# Patient Record
Sex: Female | Born: 1950 | Race: White | Hispanic: No | Marital: Married | Smoking: Former smoker
Health system: Southern US, Community
[De-identification: ages and names within clinical notes are randomized; demographics above are authoritative.]

## PROBLEM LIST (undated history)

## (undated) HISTORY — PX: TONSILLECTOMY: SUR1361

## (undated) HISTORY — PX: TUBAL LIGATION: SHX77

---

## 1977-03-05 HISTORY — PX: AUGMENTATION MAMMAPLASTY: SUR837

## 2004-03-05 HISTORY — PX: AUGMENTATION MAMMAPLASTY: SUR837

## 2006-03-05 HISTORY — PX: BREAST SURGERY: SHX581

## 2007-09-03 ENCOUNTER — Encounter: Admission: RE | Admit: 2007-09-03 | Discharge: 2007-09-03 | Payer: Self-pay | Admitting: Family Medicine

## 2008-10-26 ENCOUNTER — Ambulatory Visit: Payer: Self-pay | Admitting: Internal Medicine

## 2008-10-27 ENCOUNTER — Ambulatory Visit: Payer: Self-pay | Admitting: Internal Medicine

## 2009-10-27 ENCOUNTER — Ambulatory Visit: Payer: Self-pay | Admitting: Internal Medicine

## 2010-10-30 ENCOUNTER — Ambulatory Visit: Payer: Self-pay | Admitting: Internal Medicine

## 2011-10-31 ENCOUNTER — Ambulatory Visit: Payer: Self-pay | Admitting: Internal Medicine

## 2012-02-15 ENCOUNTER — Ambulatory Visit: Payer: Self-pay | Admitting: Internal Medicine

## 2012-02-21 ENCOUNTER — Ambulatory Visit: Payer: Self-pay | Admitting: Internal Medicine

## 2013-01-06 ENCOUNTER — Ambulatory Visit: Payer: Self-pay | Admitting: Internal Medicine

## 2013-05-29 ENCOUNTER — Encounter: Payer: Self-pay | Admitting: General Surgery

## 2013-06-18 ENCOUNTER — Ambulatory Visit (INDEPENDENT_AMBULATORY_CARE_PROVIDER_SITE_OTHER): Payer: BC Managed Care – PPO | Admitting: General Surgery

## 2013-06-18 ENCOUNTER — Encounter: Payer: Self-pay | Admitting: General Surgery

## 2013-06-18 VITALS — BP 120/74 | HR 68 | Resp 12 | Ht 67.0 in | Wt 154.0 lb

## 2013-06-18 DIAGNOSIS — Z1211 Encounter for screening for malignant neoplasm of colon: Secondary | ICD-10-CM

## 2013-06-18 MED ORDER — POLYETHYLENE GLYCOL 3350 17 GM/SCOOP PO POWD: 1.0000 | Freq: Once | ORAL | Status: DC

## 2013-06-18 NOTE — Patient Instructions (Addendum)
Colonoscopy A colonoscopy is an exam to look at the entire large intestine (colon). This exam can help find problems such as tumors, polyps, inflammation, and areas of bleeding. The exam takes about 1 hour.  LET [facility] CARE PROVIDER KNOW ABOUT:   Any allergies you have.  All medicines you are taking, including vitamins, herbs, eye drops, creams, and over-the-counter medicines.  Previous problems you or members of your family have had with the use of anesthetics.  Any blood disorders you have.  Previous surgeries you have had.  Medical conditions you have. RISKS AND COMPLICATIONS  Generally, this is a safe procedure. However, as with any procedure, complications can occur. Possible complications include:  Bleeding.  Tearing or rupture of the colon wall.  Reaction to medicines given during the exam.  Infection (rare). BEFORE THE PROCEDURE   Ask your health care provider about changing or stopping your regular medicines.  You may be prescribed an oral bowel prep. This involves drinking a large amount of medicated liquid, starting the day before your procedure. The liquid will cause you to have multiple loose stools until your stool is almost clear or light green. This cleans out your colon in preparation for the procedure.  Do not eat or drink anything else once you have started the bowel prep, unless your health care provider tells you it is safe to do so.  Arrange for someone to drive you home after the procedure. PROCEDURE   You will be given medicine to help you relax (sedative).  You will lie on your side with your knees bent.  A long, flexible tube with a light and camera on the end (colonoscope) will be inserted through the rectum and into the colon. The camera sends video back to a computer screen as it moves through the colon. The colonoscope also releases carbon dioxide gas to inflate the colon. This helps your health care provider see the area better.  During  the exam, your health care provider may take a small tissue sample (biopsy) to be examined under a microscope if any abnormalities are found.  The exam is finished when the entire colon has been viewed. AFTER THE PROCEDURE   Do not drive for 24 hours after the exam.  You may have a small amount of blood in your stool.  You may pass moderate amounts of gas and have mild abdominal cramping or bloating. This is caused by the gas used to inflate your colon during the exam.  Ask when your test results will be ready and how you will get your results. Make sure you get your test results. Document Released: 02/17/2000 Document Revised: 12/10/2012 Document Reviewed: 10/27/2012 [employer] Patient Information 2014 [place].   Patient has been scheduled for a colonoscopy at [facility] for 07/22/13. Patient is aware of date and instructions. Miralax prescription has been sent to her pharmacy. Patient is aware to pre register at least 2 business days prior to her procedure.

## 2013-06-18 NOTE — Progress Notes (Signed)
Patient ID: Stephanie Peters, female   DOB: January 27, 1951, 63 y.o.   MRN: [id number]  Chief Complaint  Patient presents with  . Other    New Patient evaluation for screening colonoscopy    HPI Stephanie Peters is a 63 y.o. female who presents for an evaluation of a screening colonoscopy due to her age. No family history of colon problems. The patient denies any problems with the bowels at this time. No prior colonoscopy.   HPI  History reviewed. No pertinent past medical history.  Past Surgical History  Procedure Laterality Date  . Breast surgery  2008    breast implants    History reviewed. No pertinent family history.  Social History History  Substance Use Topics  . Smoking status: Former Smoker -- 2 years  . Smokeless tobacco: Not on file  . Alcohol Use: Yes    Allergies  Allergen Reactions  . Codeine Nausea Only    Current Outpatient Prescriptions  Medication Sig Dispense Refill  . aspirin 81 MG tablet Take 81 mg by mouth daily.      Marland Kitchen CALCIUM CITRATE PO Take 2 tablets by mouth daily.      Marland Kitchen GLUCOSAMINE-CHONDROITIN PO Take 2 tablets by mouth daily.      . Misc Natural Products (NF FORMULAS TESTOSTERONE PO) Take 4 mg by mouth daily. Compound drug      . Multiple Vitamin (MULTIVITAMIN) tablet Take 1 tablet by mouth daily.      . Multiple Vitamins-Minerals (PRESERVISION AREDS 2 PO) Take 2 tablets by mouth daily.      . NONFORMULARY OR COMPOUNDED ITEM Take 1 tablet by mouth daily. Triest 2.25/ Prog .75mg  E 67m      . vitamin C (ASCORBIC ACID) 500 MG tablet Take 500 mg by mouth 2 (two) times daily.      . vitamin E 1000 UNIT capsule Take 1,000 Units by mouth daily.      . polyethylene glycol powder (GLYCOLAX/MIRALAX) powder Take 255 g (1 Container total) by mouth once.  255 g  0   No current facility-administered medications for this visit.    Review of Systems Review of Systems  Constitutional: Negative.   Respiratory: Negative.   Cardiovascular: Negative.    Gastrointestinal: Negative.     Blood pressure 120/74, pulse 68, resp. rate 12, height 5\' 7"  (1.702 m), weight 154 lb (69.854 kg).  Physical Exam Physical Exam  Constitutional: She is oriented to person, place, and time. She appears well-developed and well-nourished.  Neck: Neck supple. No thyromegaly present.  Cardiovascular: Normal rate, regular rhythm and normal heart sounds.   No murmur heard. Pulmonary/Chest: Effort normal and breath sounds normal.  Abdominal: Normal appearance. There is no hepatosplenomegaly. There is no tenderness. No hernia.  Lymphadenopathy:    She has no cervical adenopathy.  Neurological: She is alert and oriented to person, place, and time.  Skin: Skin is warm and dry.    Data Reviewed PCP notes of October 01, 2012.  Laboratory studies is September 24, 2012 showed a hemoglobin of 13.3 with an MCV of 92 and white blood cell count 5200 with normal differential. Comprehensive metabolic panel notable for an elevation of alkaline phosphatase at 126 (39-117).Remaining liver function studies normal.  TSH 5.95 (0.45-4.5).   Assessment    Candidate for screening colonoscopy.     Plan    The pros and cons of elective colonoscopy have been reviewed. The risks of the procedure including those related to bleeding and perforation were discussed. This  will be scheduled at a convenient day.     Patient has been scheduled for a colonoscopy at [facility] for 07/22/13. Patient is aware of date and instructions. Miralax prescription has been sent to her pharmacy. Patient is aware to pre register at least 2 business days prior to her procedure.  PCP and Ref MD: Benita Stabile MD   Forest Gleason Byrnett 06/19/2013, 5:29 AM

## 2013-06-19 ENCOUNTER — Other Ambulatory Visit: Payer: Self-pay | Admitting: General Surgery

## 2013-06-19 ENCOUNTER — Encounter: Payer: Self-pay | Admitting: General Surgery

## 2013-06-19 DIAGNOSIS — Z1211 Encounter for screening for malignant neoplasm of colon: Secondary | ICD-10-CM

## 2013-07-21 ENCOUNTER — Telehealth: Payer: Self-pay

## 2013-07-21 NOTE — Telephone Encounter (Signed)
Spoke with patient regarding her colonoscopy prep today. She has had no changes in her health status or medications. She has pre registered with the hospital already and is aware to call between 1p and 3p to find out her arrival time. Patient is aware of all instructions.

## 2013-07-22 ENCOUNTER — Ambulatory Visit: Payer: Self-pay | Admitting: General Surgery

## 2013-07-22 DIAGNOSIS — Z1211 Encounter for screening for malignant neoplasm of colon: Secondary | ICD-10-CM

## 2013-07-28 ENCOUNTER — Encounter: Payer: Self-pay | Admitting: General Surgery

## 2013-09-01 IMAGING — CT CT ABDOMEN WO/W CM
4 of 8 series · 11 of 32 positions shown, 15 images · IV contrast (APPLIED)
Comparison: none

REASON FOR EXAM: liver cyst found on US
COMMENTS:

[Series 2: wo 3 · axial · 0.75mm/px · z∈[-982,-838]mm · 3 of 98 slices shown]
[im 25/98  soft-tissue]
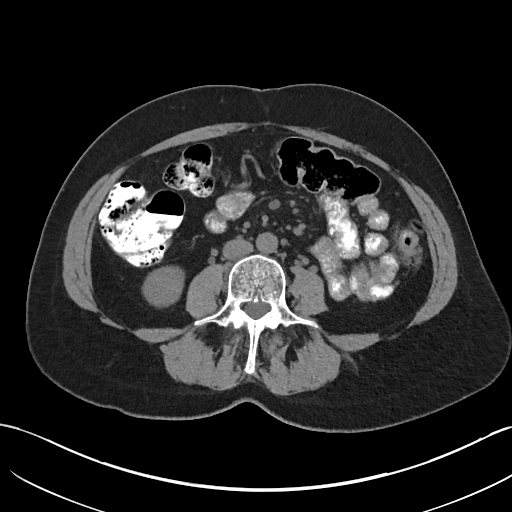
[im 49/98  soft-tissue]
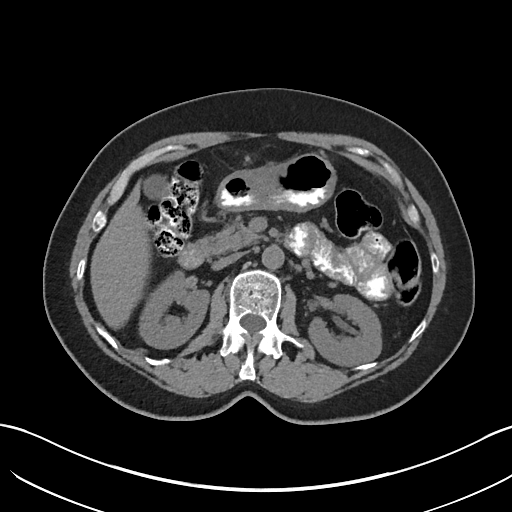
[im 73/98  soft-tissue]
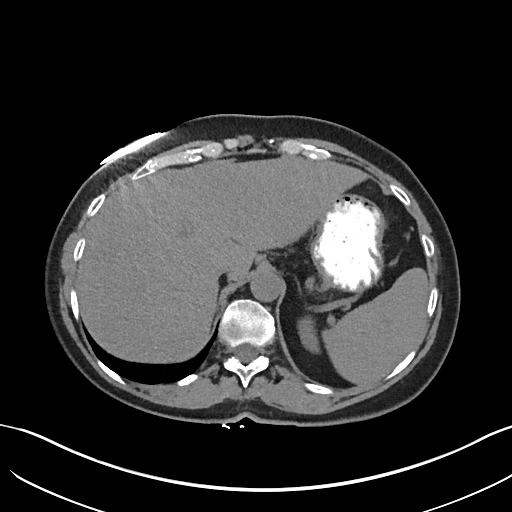

[Series 6: arterial 3 · axial · arterial · 0.75mm/px · z∈[-982,-838]mm · 3 of 98 slices shown]
[im 25/98  soft-tissue]
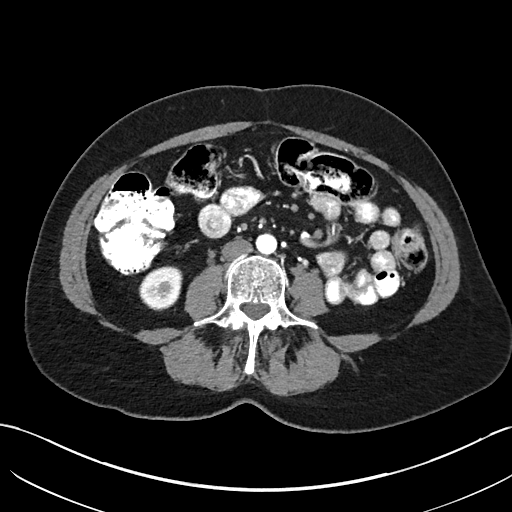
[im 49/98  soft-tissue]
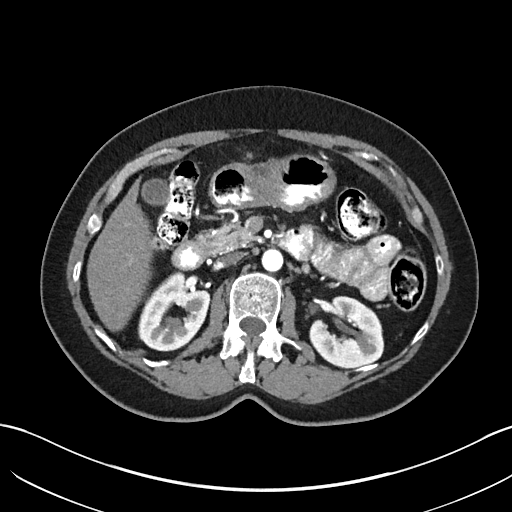
[im 73/98  soft-tissue]
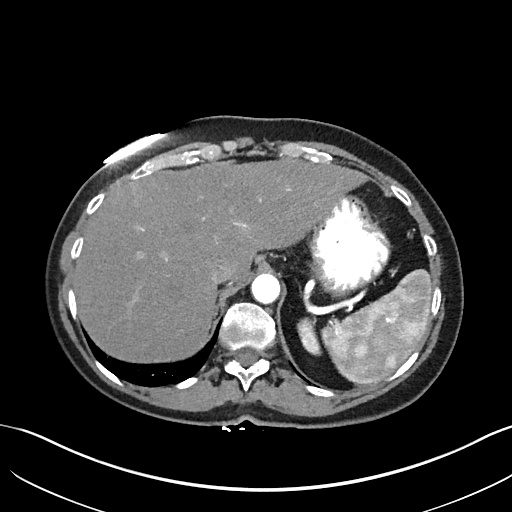

[Series 8: venous 3 · axial · portal-venous · 0.75mm/px · z∈[-982,-838]mm · 3 of 98 slices shown, 7 images]
[im 25/98  soft-tissue]
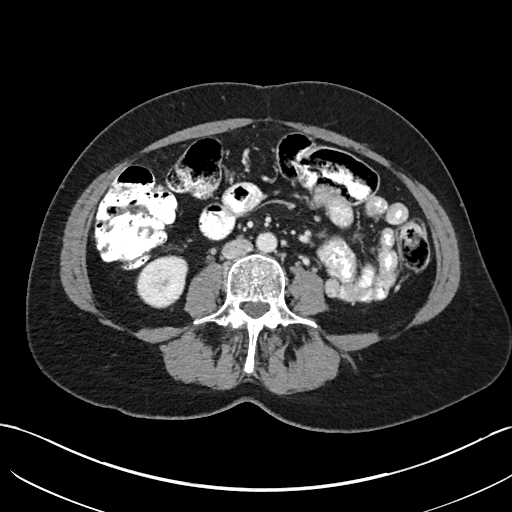
[im 25/98  lung]
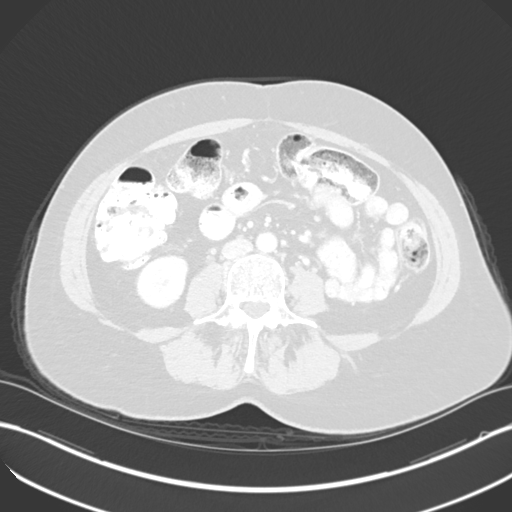
[im 25/98  bone]
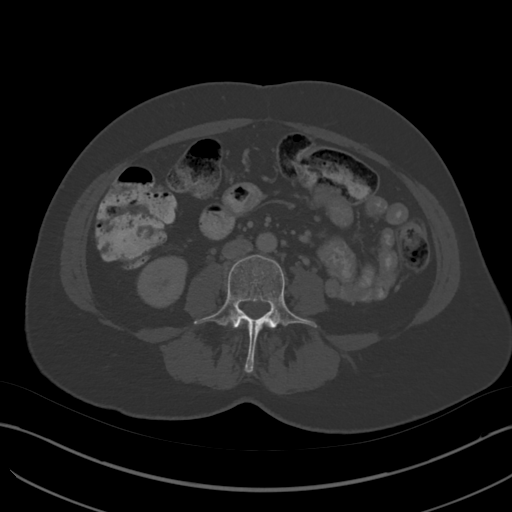
[im 49/98  soft-tissue]
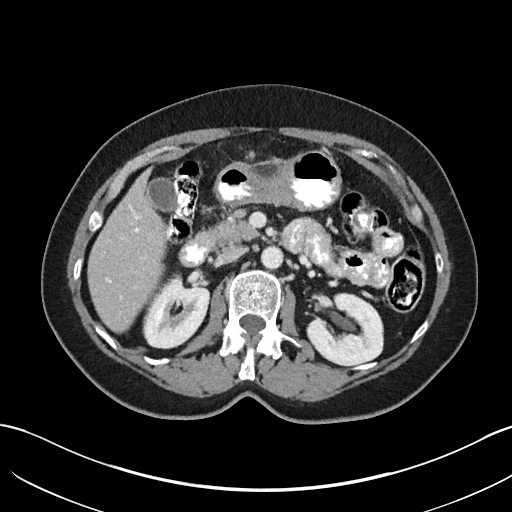
[im 49/98  lung]
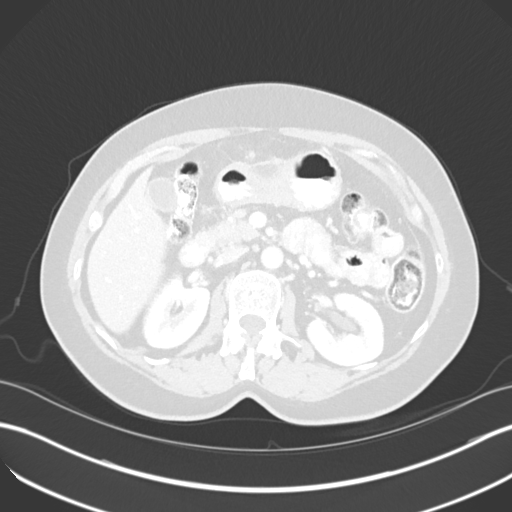
[im 73/98  soft-tissue]
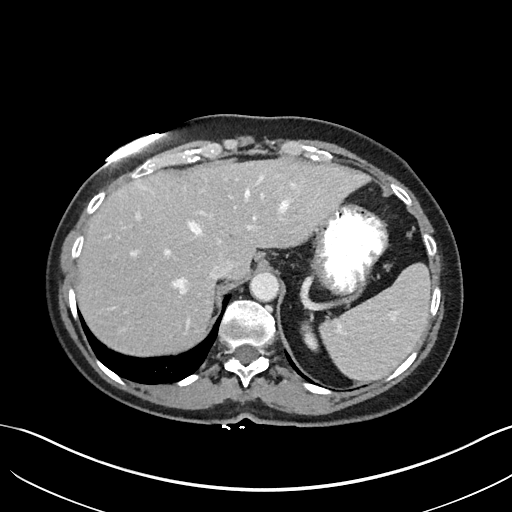
[im 73/98  lung]
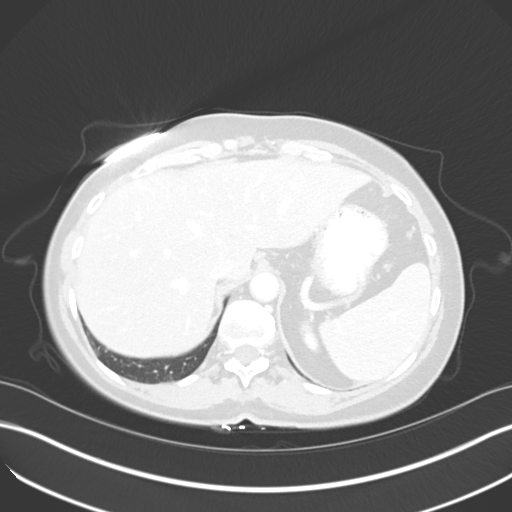

[Series 10: delay 3 · axial · delayed · 0.75mm/px · z∈[-958,-862]mm · 2 of 98 slices shown]
[im 33/98  soft-tissue]
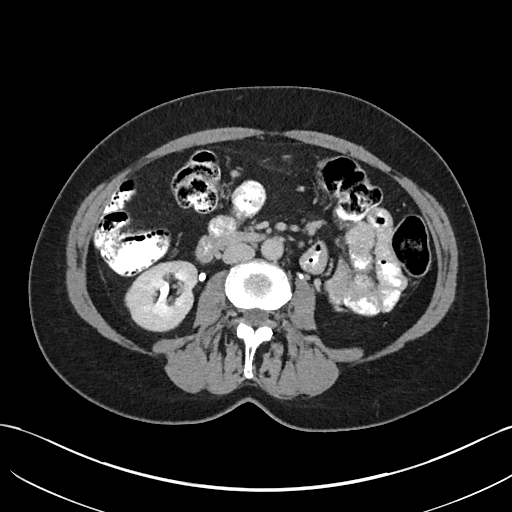
[im 65/98  soft-tissue]
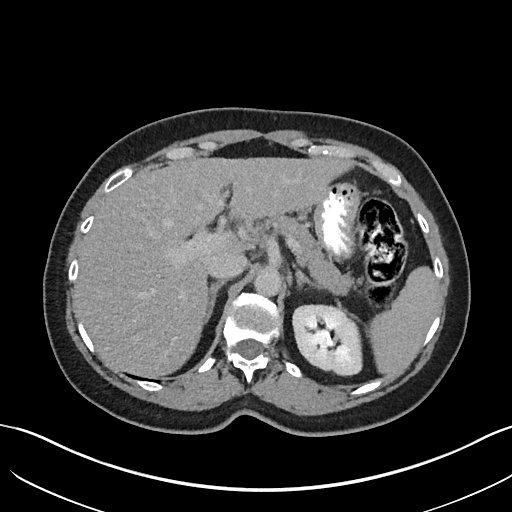

[11 of 32 positions shown; findings below may reference images not displayed]

PROCEDURE:     KCT - KCT ABDOMEN STANDARD W/WO  - February 21, 2012  [DATE]

RESULT:     Multiphasic CT of the abdomen is performed utilizing 100 mL of
Zsovue-KWN iodinated intravenous contrast. There is no previous CT for
comparison. Correlation is made with a previous abdominal sonogram.

Noncontrast images demonstrate a subdiaphragmatic right lobe lobulated
cystic structure measuring 1.34 x 0.86 cm. The area seen on ultrasound
appears to be near the porta hepatis on image #28 measuring approximately
1.52 cm medial to lateral x 1.32 cm anterior to posterior on image #28. No
radiopaque gallstones or renal calculi are evident. Following contrast
administration a third small hypoattenuating area is seen on image #33 with
a diameter of approximately 7.0 mm. The spleen shows a heterogeneous pattern
of attenuation on the arterial phase images with a homogeneous pattern on
the delayed phase images. The porta hepatis cyst and the smaller more
inferior right hepatic cyst show persistent low-attenuation. The area in the
subdiaphragmatic portion of the right lobe of the liver again shows a
somewhat lobulated appearance possibly with septation. There may be some
faint enhancement in a globular fashion along the margins of this lesion on
the delayed images raising the possibility of a somewhat atypical
hemangioma. The other two lesions appear to represent stable cysts. The
liver and spleen appear normal in size. The kidneys show no obstruction or
mass evident. The aorta and inferior vena cava are unremarkable. Both
kidneys excrete contrast opacified urine on the delayed phase images without
obstructive change. The bowel appears unremarkable. No adenopathy is
evident. The pancreas, gallbladder and adrenal glands appear unremarkable.
IMPRESSION: 1. At least two cysts in the liver including the area of concern
sonographically. A subdiaphragmatic area could represent multiple adjacent
cysts with septation. An atypical hemangioma is not completely excluded but
felt to be less likely.

[REDACTED]

## 2014-01-04 ENCOUNTER — Encounter: Payer: Self-pay | Admitting: General Surgery

## 2014-01-11 ENCOUNTER — Ambulatory Visit: Payer: Self-pay | Admitting: Internal Medicine

## 2014-01-21 ENCOUNTER — Ambulatory Visit: Payer: Self-pay | Admitting: Internal Medicine

## 2015-03-09 ENCOUNTER — Ambulatory Visit: Payer: Self-pay

## 2015-03-09 ENCOUNTER — Encounter: Payer: Self-pay | Admitting: Podiatry

## 2015-03-09 ENCOUNTER — Ambulatory Visit (INDEPENDENT_AMBULATORY_CARE_PROVIDER_SITE_OTHER): Payer: BLUE CROSS/BLUE SHIELD | Admitting: Podiatry

## 2015-03-09 VITALS — BP 107/62 | HR 66 | Resp 18

## 2015-03-09 DIAGNOSIS — M779 Enthesopathy, unspecified: Secondary | ICD-10-CM

## 2015-03-09 DIAGNOSIS — D3610 Benign neoplasm of peripheral nerves and autonomic nervous system, unspecified: Secondary | ICD-10-CM

## 2015-03-09 DIAGNOSIS — B079 Viral wart, unspecified: Secondary | ICD-10-CM

## 2015-03-09 DIAGNOSIS — B078 Other viral warts: Secondary | ICD-10-CM

## 2015-03-09 NOTE — Progress Notes (Signed)
   Subjective:    Patient ID: Stephanie Peters, female    DOB: 02/16/1951, 65 y.o.   MRN: [id number]  HPI I have a spot on the bottom of my right foot and it has been going on for about 6 months    Review of Systems  All other systems reviewed and are negative.      Objective:   Physical Exam: 65 year old female in no apparent distress vital signs stable alert and oriented 3. Pulses are strongly palpable. Neurologic sensorium is intact per Semmes-Weinstein monofilament. Deep tendon reflexes are intact bilaterally muscle strength was 5 over 5 dorsiflexion to flexors and inverters everters on his musculature is intact. Orthopedic evaluation of his digits all joints distal to the ankle for range of motion without crepitation. Cutaneous evaluation of a straight supple well-hydrated cutis loss of fat pad plantarly bilateral. What appears to be a verruca vulgaris plantar aspect third metatarsal head right foot skin lines circumvented the lesion and thrombosed capillaries are noted.        Assessment & Plan:  Assessment: Verruca plantaris right.  Plan: Surgical curettage was performed today under local anesthetic and she tolerated the procedure well. She was given both oral and written home-going instructions for wound care and soaking of her foot and I will follow-up with her in 1 week. She will call with questions or concerns.

## 2015-03-09 NOTE — Patient Instructions (Signed)
Betadine Soak Instructions  Purchase an 8 oz. bottle of BETADINE solution (Povidone)  THE DAY AFTER THE PROCEDURE  Place 1 tablespoon of betadine solution in a quart of warm tap water.  Submerge your foot or feet with outer bandage intact for the initial soak; this will allow the bandage to become moist and wet for easy lift off.  Once you remove your bandage, continue to soak in the solution for 20 minutes.  This soak should be done twice a day.  Next, remove your foot or feet from solution, blot dry the affected area and cover.  You may use a band aid large enough to cover the area or use gauze and tape.  Apply other medications to the area as directed by the doctor such as cortisporin otic solution (ear drops) or neosporin.  IF YOUR SKIN BECOMES IRRITATED WHILE USING THESE INSTRUCTIONS, IT IS OKAY TO SWITCH TO EPSOM SALTS AND WATER OR WHITE VINEGAR AND WATER.WARTS (Verrucae)  Warts are caused by a virus that has invaded the skin.  They are more common in young adults and children and a small percentage will resolve on their own.  There are many types of warts including mosaic warts (large flat), vulgaris (domed warts-have pearl like appearance), and plantar warts (flat or cauliflower like appearance).  Warts are highly contagious and may be picked up from any surface.  Warts thrive in a warm moist environment and are common near pools, showers, and locker room floors.  Any microscopic cut in the skin is where the virus enters and becomes a wart.  Warts are very difficult to treat and get rid of.  Patience is necessary in the treatment of this virus.  It may take months to cure and different methods may have to be used to get rid of your wart.  Standard Initial Treatment is: 1. Periodic debridement of the wart and application of Canthacur to each lesion (a blistering agent that will slough off the warty skin) 2. Dispensing of topical treatments/prescriptions to apply to the wart at home  Other  options include: 1. Excision of the lesion-numbing the skin around the wart and cutting it out-requires daily soaks post-operatively and takes about 2-3 weeks to fully heal 2. Excision with CO2 Laser-Performed at the surgical center your foot is numbed up and the lesions are all cut out and then lasered with a high power laser.  Very good for multiple warts that are resistant. 3. Cimetidine (Tagamet)-Oral agent used in high does--has shown better results in children  How do I apply the standard topical treatments?  1. Salicylic Acid (Compound W wart remover liquid or gel-available at drug or grocery stores)-Apply a dime size thickness over the wart and cover with duct tape-apply at night so the medication does not spread out to the good skin.  The skin will turn white and slowly blister off.  Use a pumice stone daily to remove the white skin as best you can.  If the skin gets too raw and painful, discontinue for a few days then resume. 2. Aldara (Imiquimod)-this is an immune response modifier.  They come in little packets so try to get at least 2 days out of each packet if you can.  Apply a small amount to the lesion and cover with duct tape.  Do not rub it in-let it absorb on its own.  Good to apply each morning.  Other Helpful Hints:  Wash shoes that can be washed in the washing machine 2-3 x  per month with some bleach  Use Lysol in shoes that cannot be washed and wipe out with a cloth 1 x per week-allow to dry for 8 hours before wearing again  Use a bleach solution (1 part bleach to 3 parts water) in your tub or shower to reduce the spread of the virus to yourself and others  Use aqua socks or clean sandals when at the pool or locker room to reduce the chance of picking up the virus or spreading it to others

## 2015-03-11 ENCOUNTER — Telehealth: Payer: Self-pay

## 2015-03-11 NOTE — Telephone Encounter (Signed)
SPoke with pt regarding soaking instrucctions, advise to soak foot 1-2 weeks as instructed.

## 2015-03-16 ENCOUNTER — Encounter: Payer: Self-pay | Admitting: Podiatry

## 2015-03-16 ENCOUNTER — Ambulatory Visit (INDEPENDENT_AMBULATORY_CARE_PROVIDER_SITE_OTHER): Payer: BLUE CROSS/BLUE SHIELD | Admitting: Podiatry

## 2015-03-16 DIAGNOSIS — B079 Viral wart, unspecified: Secondary | ICD-10-CM

## 2015-03-16 DIAGNOSIS — B078 Other viral warts: Secondary | ICD-10-CM

## 2015-03-16 DIAGNOSIS — B070 Plantar wart: Secondary | ICD-10-CM

## 2015-03-16 NOTE — Progress Notes (Signed)
She presents today for follow-up of wart curettage forefoot right. She states that she was soaking and everything was going good that she stop soaking and is started throbbing. When she went back to soaking she said the foot film much better. She covers with a Band-Aid twice daily.  Objective: Vital signs are stable alert and oriented 3. Pulses are strongly palpable. Neurologic sensorium is intact. Superficial wound plantar aspect of the right foot appears to be granulating in very nicely with epithelization occurring no signs of infection.  Assessment: 1 nonsurgical foot right.  Plan: Discontinue Betadine sterile with Epsom salts and warm water soaks covered during the day and leave open at bedtime. Follow-up with me should this not going to heal in the next couple of weeks.

## 2015-08-22 ENCOUNTER — Ambulatory Visit (INDEPENDENT_AMBULATORY_CARE_PROVIDER_SITE_OTHER): Payer: BLUE CROSS/BLUE SHIELD | Admitting: Podiatry

## 2015-08-22 ENCOUNTER — Encounter: Payer: Self-pay | Admitting: Podiatry

## 2015-08-22 DIAGNOSIS — Q828 Other specified congenital malformations of skin: Secondary | ICD-10-CM

## 2015-08-22 NOTE — Progress Notes (Signed)
She presents today states I feel that my wart has come back.  Objective: Vital signs are stable she is alert and oriented 3 pulses are palpable. Evaluation of the plantar right foot does demonstrate solitary porokeratotic lesion it appears to be only porokeratosis no wart present.  Assessment: Porokeratosis painful lesion benign right.  Plan: Chemical destruction of the lesion today after mechanical debridement. Follow up with her as needed.

## 2015-10-17 ENCOUNTER — Other Ambulatory Visit: Payer: Self-pay | Admitting: Internal Medicine

## 2015-10-17 DIAGNOSIS — Z1231 Encounter for screening mammogram for malignant neoplasm of breast: Secondary | ICD-10-CM

## 2015-10-27 DIAGNOSIS — M25559 Pain in unspecified hip: Secondary | ICD-10-CM | POA: Insufficient documentation

## 2015-11-08 ENCOUNTER — Other Ambulatory Visit: Payer: Self-pay | Admitting: Internal Medicine

## 2015-11-08 DIAGNOSIS — Z1231 Encounter for screening mammogram for malignant neoplasm of breast: Secondary | ICD-10-CM

## 2015-11-09 ENCOUNTER — Ambulatory Visit: Payer: Self-pay

## 2015-11-29 ENCOUNTER — Other Ambulatory Visit: Payer: Self-pay | Admitting: Internal Medicine

## 2015-11-29 ENCOUNTER — Ambulatory Visit
Admission: RE | Admit: 2015-11-29 | Discharge: 2015-11-29 | Disposition: A | Discharge status: Home / Self Care | Payer: BLUE CROSS/BLUE SHIELD | Source: Ambulatory Visit | Attending: Internal Medicine | Admitting: Internal Medicine

## 2015-11-29 DIAGNOSIS — Z1231 Encounter for screening mammogram for malignant neoplasm of breast: Secondary | ICD-10-CM

## 2015-11-29 DIAGNOSIS — Z9882 Breast implant status: Secondary | ICD-10-CM | POA: Diagnosis not present

## 2017-01-22 ENCOUNTER — Other Ambulatory Visit: Payer: Self-pay | Admitting: Internal Medicine

## 2017-01-22 DIAGNOSIS — Z1231 Encounter for screening mammogram for malignant neoplasm of breast: Secondary | ICD-10-CM

## 2017-03-12 ENCOUNTER — Ambulatory Visit
Admission: RE | Admit: 2017-03-12 | Discharge: 2017-03-12 | Disposition: A | Discharge status: Home / Self Care | Payer: Medicare Other | Source: Ambulatory Visit | Attending: Internal Medicine | Admitting: Internal Medicine

## 2017-03-12 DIAGNOSIS — Z1231 Encounter for screening mammogram for malignant neoplasm of breast: Secondary | ICD-10-CM | POA: Insufficient documentation

## 2018-01-28 ENCOUNTER — Other Ambulatory Visit: Payer: Self-pay | Admitting: Internal Medicine

## 2018-01-28 DIAGNOSIS — M8580 Other specified disorders of bone density and structure, unspecified site: Secondary | ICD-10-CM

## 2018-02-03 ENCOUNTER — Other Ambulatory Visit: Payer: Self-pay | Admitting: Internal Medicine

## 2018-02-03 DIAGNOSIS — Z1231 Encounter for screening mammogram for malignant neoplasm of breast: Secondary | ICD-10-CM

## 2018-03-25 ENCOUNTER — Ambulatory Visit
Admission: RE | Admit: 2018-03-25 | Discharge: 2018-03-25 | Disposition: A | Discharge status: Home / Self Care | Payer: Medicare Other | Source: Ambulatory Visit | Attending: Internal Medicine | Admitting: Internal Medicine

## 2018-03-25 DIAGNOSIS — Z1231 Encounter for screening mammogram for malignant neoplasm of breast: Secondary | ICD-10-CM | POA: Insufficient documentation

## 2018-03-25 DIAGNOSIS — M8580 Other specified disorders of bone density and structure, unspecified site: Secondary | ICD-10-CM

## 2018-03-25 DIAGNOSIS — M8588 Other specified disorders of bone density and structure, other site: Secondary | ICD-10-CM | POA: Diagnosis not present

## 2019-02-05 ENCOUNTER — Other Ambulatory Visit: Payer: Self-pay | Admitting: Internal Medicine

## 2019-02-05 DIAGNOSIS — Z1231 Encounter for screening mammogram for malignant neoplasm of breast: Secondary | ICD-10-CM

## 2019-03-30 ENCOUNTER — Ambulatory Visit
Admission: RE | Admit: 2019-03-30 | Discharge: 2019-03-30 | Disposition: A | Discharge status: Home / Self Care | Payer: Medicare Other | Source: Ambulatory Visit | Attending: Internal Medicine | Admitting: Internal Medicine

## 2019-03-30 ENCOUNTER — Other Ambulatory Visit: Payer: Self-pay

## 2019-03-30 DIAGNOSIS — Z1231 Encounter for screening mammogram for malignant neoplasm of breast: Secondary | ICD-10-CM | POA: Diagnosis present

## 2019-04-15 ENCOUNTER — Ambulatory Visit: Payer: BLUE CROSS/BLUE SHIELD | Admitting: Podiatry

## 2019-04-20 ENCOUNTER — Other Ambulatory Visit: Payer: Self-pay

## 2019-04-20 ENCOUNTER — Ambulatory Visit (INDEPENDENT_AMBULATORY_CARE_PROVIDER_SITE_OTHER): Payer: Medicare Other | Admitting: Podiatry

## 2019-04-20 DIAGNOSIS — Q828 Other specified congenital malformations of skin: Secondary | ICD-10-CM | POA: Diagnosis not present

## 2019-04-20 NOTE — Progress Notes (Signed)
Subjective:  Patient ID: Stephanie Peters, female    DOB: Mar 21, 1950,  MRN: [id number] HPI: Have a spot on the bottom of my foot is been bothering me for the past several months.  I think is another wart left No chief complaint on file.   69 y.o. female presents with the above complaint.   ROS: Denies fever chills nausea vomiting muscle aches pains calf pain back pain chest pain shortness of breath.  No past medical history on file. Past Surgical History:  Procedure Laterality Date  . AUGMENTATION MAMMAPLASTY Bilateral [id number]   silicone  . AUGMENTATION MAMMAPLASTY Bilateral 2006   replaced with saline  . BREAST SURGERY  2008   breast implants    Current Outpatient Medications:  .  triamcinolone cream (KENALOG) 0.1 %, Apply to affected area every day for 1 week then apply to affected area three times weekly from then on, Disp: , Rfl:  .  aspirin 81 MG tablet, Take 81 mg by mouth daily., Disp: , Rfl:  .  CALCIUM CITRATE PO, Take 2 tablets by mouth daily., Disp: , Rfl:  .  fluconazole (DIFLUCAN) 200 MG tablet, Take 200 mg by mouth daily., Disp: , Rfl:  .  GLUCOSAMINE-CHONDROITIN PO, Take 2 tablets by mouth daily., Disp: , Rfl:  .  lidocaine (XYLOCAINE) 2 % jelly, APP EXT AA QID PRN, Disp: , Rfl:  .  meloxicam (MOBIC) 7.5 MG tablet, meloxicam 7.5 mg tablet  TK 1 T PO  BID WITH MEALS, Disp: , Rfl:  .  Misc Natural Products (NF FORMULAS TESTOSTERONE PO), Take 4 mg by mouth daily. Compound drug, Disp: , Rfl:  .  mometasone (ELOCON) 0.1 % ointment, APPLY EXTERNALLY TO THE AFFECTED AREA TWICE DAILY, Disp: , Rfl:  .  Multiple Vitamin (MULTIVITAMIN) tablet, Take 1 tablet by mouth daily., Disp: , Rfl:  .  Multiple Vitamins-Minerals (PRESERVISION AREDS 2 PO), Take 2 tablets by mouth daily., Disp: , Rfl:  .  NONFORMULARY OR COMPOUNDED ITEM, Take 1 tablet by mouth daily. Triest 2.25/ Prog .75mg  E 25m, Disp: , Rfl:  .  polyethylene glycol powder (GLYCOLAX/MIRALAX) powder, Take 255 g (1 Container  total) by mouth once., Disp: 255 g, Rfl: 0 .  valACYclovir (VALTREX) 500 MG tablet, , Disp: , Rfl:  .  vitamin C (ASCORBIC ACID) 500 MG tablet, Take 500 mg by mouth 2 (two) times daily., Disp: , Rfl:  .  vitamin E 1000 UNIT capsule, Take 1,000 Units by mouth daily., Disp: , Rfl:   Allergies  Allergen Reactions  . Codeine Nausea Only   Review of Systems Objective:  There were no vitals filed for this visit.  General: Well developed, nourished, in no acute distress, alert and oriented x3   Dermatological: Skin is warm, dry and supple bilateral. Nails x 10 are well maintained; remaining integument appears unremarkable at this time. There are no open sores, no preulcerative lesions, no rash or signs of infection present.  Porokeratosis plantar aspect left foot noncomplicated just proximal to the third toe.  Vascular: Dorsalis Pedis artery and Posterior Tibial artery pedal pulses are 2/4 bilateral with immedate capillary fill time. Pedal hair growth present. No varicosities and no lower extremity edema present bilateral.   Neruologic: Grossly intact via light touch bilateral. Vibratory intact via tuning fork bilateral. Protective threshold with Semmes Wienstein monofilament intact to all pedal sites bilateral. Patellar and Achilles deep tendon reflexes 2+ bilateral. No Babinski or clonus noted bilateral.   Musculoskeletal: No gross boney pedal deformities  bilateral. No pain, crepitus, or limitation noted with foot and ankle range of motion bilateral. Muscular strength 5/5 in all groups tested bilateral.  Gait: Unassisted, Nonantalgic.    Radiographs:  None taken  Assessment & Plan:   Assessment: Porokeratosis plantar aspect left foot just proximal to the third toe  Plan: Mechanical destruction with chemical destruction followed Cantharone under occlusion to be left on until tomorrow and then washed off thoroughly.  Will call us with questions or concerns.  Follow-up with me as  needed     Merly Hinkson T. [place], [place]

## 2019-11-10 ENCOUNTER — Other Ambulatory Visit: Payer: Self-pay

## 2019-11-10 ENCOUNTER — Encounter: Payer: Self-pay | Admitting: Ophthalmology

## 2019-11-16 ENCOUNTER — Other Ambulatory Visit
Admission: RE | Admit: 2019-11-16 | Discharge: 2019-11-16 | Disposition: A | Discharge status: Home / Self Care | Payer: Medicare Other | Source: Ambulatory Visit | Attending: Ophthalmology | Admitting: Ophthalmology

## 2019-11-16 ENCOUNTER — Other Ambulatory Visit: Payer: Self-pay

## 2019-11-16 DIAGNOSIS — Z01812 Encounter for preprocedural laboratory examination: Secondary | ICD-10-CM | POA: Diagnosis present

## 2019-11-16 DIAGNOSIS — Z20822 Contact with and (suspected) exposure to covid-19: Secondary | ICD-10-CM | POA: Insufficient documentation

## 2019-11-16 NOTE — Discharge Instructions (Signed)

## 2019-11-17 LAB — SARS CORONAVIRUS 2 (TAT 6-24 HRS): SARS Coronavirus 2: NEGATIVE

## 2019-11-18 ENCOUNTER — Ambulatory Visit
Admission: RE | Admit: 2019-11-18 | Discharge: 2019-11-18 | Disposition: A | Discharge status: Home / Self Care | Payer: Medicare Other | Attending: Ophthalmology | Admitting: Ophthalmology

## 2019-11-18 ENCOUNTER — Other Ambulatory Visit: Payer: Self-pay

## 2019-11-18 ENCOUNTER — Ambulatory Visit: Payer: Medicare Other | Admitting: Anesthesiology

## 2019-11-18 ENCOUNTER — Encounter: Payer: Self-pay | Admitting: Ophthalmology

## 2019-11-18 ENCOUNTER — Encounter
Admission: RE | Disposition: A | Discharge status: Home / Self Care | Payer: Self-pay | Source: Home / Self Care | Attending: Ophthalmology

## 2019-11-18 DIAGNOSIS — Z87891 Personal history of nicotine dependence: Secondary | ICD-10-CM | POA: Diagnosis not present

## 2019-11-18 DIAGNOSIS — H2511 Age-related nuclear cataract, right eye: Secondary | ICD-10-CM | POA: Diagnosis present

## 2019-11-18 DIAGNOSIS — M1990 Unspecified osteoarthritis, unspecified site: Secondary | ICD-10-CM | POA: Insufficient documentation

## 2019-11-18 HISTORY — PX: CATARACT EXTRACTION W/PHACO: SHX586

## 2019-11-18 SURGERY — PHACOEMULSIFICATION, CATARACT, WITH IOL INSERTION
Anesthesia: Monitor Anesthesia Care | Site: Eye | Laterality: Right

## 2019-11-18 MED ORDER — ACETAMINOPHEN 325 MG PO TABS: 325.0000 mg | ORAL_TABLET | ORAL | Status: DC | PRN

## 2019-11-18 MED ORDER — LIDOCAINE HCL (PF) 2 % IJ SOLN
INTRAOCULAR | Status: DC | PRN
  Administered 2019-11-18: 2 mL

## 2019-11-18 MED ORDER — EPINEPHRINE PF 1 MG/ML IJ SOLN
INTRAOCULAR | Status: DC | PRN
  Administered 2019-11-18: 64 mL via OPHTHALMIC

## 2019-11-18 MED ORDER — ARMC OPHTHALMIC DILATING DROPS
1.0000 "application " | OPHTHALMIC | Status: DC | PRN
  Administered 2019-11-18 (×3): 1 via OPHTHALMIC

## 2019-11-18 MED ORDER — MIDAZOLAM HCL 2 MG/2ML IJ SOLN
INTRAMUSCULAR | Status: DC | PRN
  Administered 2019-11-18: 2 mg via INTRAVENOUS

## 2019-11-18 MED ORDER — MOXIFLOXACIN HCL 0.5 % OP SOLN
1.0000 [drp] | OPHTHALMIC | Status: DC | PRN
  Administered 2019-11-18 (×3): 1 [drp] via OPHTHALMIC

## 2019-11-18 MED ORDER — TETRACAINE HCL 0.5 % OP SOLN
1.0000 [drp] | OPHTHALMIC | Status: DC | PRN
  Administered 2019-11-18 (×3): 1 [drp] via OPHTHALMIC

## 2019-11-18 MED ORDER — ONDANSETRON HCL 4 MG/2ML IJ SOLN: 4.0000 mg | Freq: Once | INTRAMUSCULAR | Status: DC | PRN

## 2019-11-18 MED ORDER — NA HYALUR & NA CHOND-NA HYALUR 0.4-0.35 ML IO KIT
PACK | INTRAOCULAR | Status: DC | PRN
  Administered 2019-11-18: 1 mL via INTRAOCULAR

## 2019-11-18 MED ORDER — FENTANYL CITRATE (PF) 100 MCG/2ML IJ SOLN
INTRAMUSCULAR | Status: DC | PRN
Start: 2019-11-18 — End: 2019-11-18
  Administered 2019-11-18 (×2): 50 ug via INTRAVENOUS

## 2019-11-18 MED ORDER — BRIMONIDINE TARTRATE-TIMOLOL 0.2-0.5 % OP SOLN
OPHTHALMIC | Status: DC | PRN
  Administered 2019-11-18: 1 [drp] via OPHTHALMIC

## 2019-11-18 MED ORDER — CEFUROXIME OPHTHALMIC INJECTION 1 MG/0.1 ML
INJECTION | OPHTHALMIC | Status: DC | PRN
  Administered 2019-11-18: 0.1 mL via INTRACAMERAL

## 2019-11-18 MED ORDER — ACETAMINOPHEN 160 MG/5ML PO SOLN: 325.0000 mg | ORAL | Status: DC | PRN

## 2019-11-18 SURGICAL SUPPLY — 23 items
CANNULA ANT/CHMB 27G (MISCELLANEOUS) ×1 IMPLANT
CANNULA ANT/CHMB 27GA (MISCELLANEOUS) ×3 IMPLANT
GLOVE SURG LX 7.5 STRW (GLOVE) ×2
GLOVE SURG LX STRL 7.5 STRW (GLOVE) ×1 IMPLANT
GLOVE SURG TRIUMPH 8.0 PF LTX (GLOVE) ×3 IMPLANT
GOWN STRL REUS W/ TWL LRG LVL3 (GOWN DISPOSABLE) ×2 IMPLANT
GOWN STRL REUS W/TWL LRG LVL3 (GOWN DISPOSABLE) ×6
LENS IOL DIOP 20.0 (Intraocular Lens) ×3 IMPLANT
LENS IOL TECNIS MONO 20.0 (Intraocular Lens) IMPLANT
MARKER SKIN DUAL TIP RULER LAB (MISCELLANEOUS) ×3 IMPLANT
NDL CAPSULORHEX 25GA (NEEDLE) ×1 IMPLANT
NDL FILTER BLUNT 18X1 1/2 (NEEDLE) ×2 IMPLANT
NEEDLE CAPSULORHEX 25GA (NEEDLE) ×3 IMPLANT
NEEDLE FILTER BLUNT 18X 1/2SAF (NEEDLE) ×4
NEEDLE FILTER BLUNT 18X1 1/2 (NEEDLE) ×2 IMPLANT
PACK CATARACT BRASINGTON (MISCELLANEOUS) ×3 IMPLANT
PACK EYE AFTER SURG (MISCELLANEOUS) ×3 IMPLANT
PACK OPTHALMIC (MISCELLANEOUS) ×3 IMPLANT
SOLUTION OPHTHALMIC SALT (MISCELLANEOUS) ×3 IMPLANT
SYR 3ML LL SCALE MARK (SYRINGE) ×6 IMPLANT
SYR TB 1ML LUER SLIP (SYRINGE) ×3 IMPLANT
WATER STERILE IRR 250ML POUR (IV SOLUTION) ×3 IMPLANT
WIPE NON LINTING 3.25X3.25 (MISCELLANEOUS) ×3 IMPLANT

## 2019-11-18 NOTE — Anesthesia Procedure Notes (Signed)
Procedure Name: MAC Date/Time: 11/18/2019 10:08 AM Performed by: Jeannene Patella, CRNA Pre-anesthesia Checklist: Patient identified, Emergency Drugs available, Suction available, Timeout performed and Patient being monitored Patient Re-evaluated:Patient Re-evaluated prior to induction Oxygen Delivery Method: Nasal cannula Placement Confirmation: positive ETCO2

## 2019-11-18 NOTE — Op Note (Signed)
LOCATION:  [place]   PREOPERATIVE DIAGNOSIS:    Nuclear sclerotic cataract right eye. H25.11   POSTOPERATIVE DIAGNOSIS:  Nuclear sclerotic cataract right eye.     PROCEDURE:  Phacoemusification with posterior chamber intraocular lens placement of the right eye   ULTRASOUND TIME: Procedure(s) with comments: CATARACT EXTRACTION PHACO AND INTRAOCULAR LENS PLACEMENT (IOC) RIGHT 5.77 00:54.1 10.6% (Right) - requests early  LENS:   Implant Name Type Inv. Item Serial No. Manufacturer Lot No. LRB No. Used Action  LENS IOL DIOP 20.0 - [id number] Intraocular Lens LENS IOL DIOP 20.0 [id number] JOHNSON   Right 1 Implanted         SURGEON:  Wyonia Hough, MD   ANESTHESIA:  Topical with tetracaine drops and 2% Xylocaine jelly, augmented with 1% preservative-free intracameral lidocaine.    COMPLICATIONS:  None.   DESCRIPTION OF PROCEDURE:  The patient was identified in the holding room and transported to the operating room and placed in the supine position under the operating microscope.  The right eye was identified as the operative eye and it was prepped and draped in the usual sterile ophthalmic fashion.   A 1 millimeter clear-corneal paracentesis was made at the 12:00 position.  0.5 ml of preservative-free 1% lidocaine was injected into the anterior chamber. The anterior chamber was filled with Viscoat viscoelastic.  A 2.4 millimeter keratome was used to make a near-clear corneal incision at the 9:00 position.  A curvilinear capsulorrhexis was made with a cystotome and capsulorrhexis forceps.  Balanced salt solution was used to hydrodissect and hydrodelineate the nucleus.   Phacoemulsification was then used in stop and chop fashion to remove the lens nucleus and epinucleus.  The remaining cortex was then removed using the irrigation and aspiration handpiece. Provisc was then placed into the capsular bag to distend it for lens placement.  A lens was then injected into the  capsular bag.  The remaining viscoelastic was aspirated.   Wounds were hydrated with balanced salt solution.  The anterior chamber was inflated to a physiologic pressure with balanced salt solution.  No wound leaks were noted. Cefuroxime 0.1 ml of a 10mg /ml solution was injected into the anterior chamber for a dose of 1 mg of intracameral antibiotic at the completion of the case.   Timolol and Brimonidine drops were applied to the eye.  The patient was taken to the recovery room in stable condition without complications of anesthesia or surgery.   Lilliona Blakeney 11/18/2019, 10:23 AM

## 2019-11-18 NOTE — Anesthesia Postprocedure Evaluation (Signed)
Anesthesia Post Note  Patient: Stephanie Peters  Procedure(s) Performed: CATARACT EXTRACTION PHACO AND INTRAOCULAR LENS PLACEMENT (IOC) RIGHT 5.77 00:54.1 10.6% (Right Eye)     Patient location during evaluation: PACU Anesthesia Type: MAC Level of consciousness: awake and alert Pain management: pain level controlled Vital Signs Assessment: post-procedure vital signs reviewed and stable Respiratory status: spontaneous breathing, nonlabored ventilation, respiratory function stable and patient connected to nasal cannula oxygen Cardiovascular status: blood pressure returned to baseline and stable Postop Assessment: no apparent nausea or vomiting Anesthetic complications: no   No complications documented.  Sinda Du

## 2019-11-18 NOTE — Anesthesia Preprocedure Evaluation (Signed)
Anesthesia Evaluation  Patient identified by MRN, date of birth, ID band Patient awake    Reviewed: Allergy & Precautions, NPO status , Patient's Chart, lab work & pertinent test results  Airway Mallampati: I  TM Distance: >3 FB Neck ROM: Full    Dental no notable dental hx.    Pulmonary former smoker,    Pulmonary exam normal breath sounds clear to auscultation       Cardiovascular Exercise Tolerance: Good negative cardio ROS Normal cardiovascular exam Rhythm:Regular Rate:Normal     Neuro/Psych negative neurological ROS  negative psych ROS   GI/Hepatic negative GI ROS, Neg liver ROS,   Endo/Other  negative endocrine ROS  Renal/GU negative Renal ROS  negative genitourinary   Musculoskeletal negative musculoskeletal ROS (+)   Abdominal Normal abdominal exam  (+) - obese,   Peds negative pediatric ROS (+)  Hematology negative hematology ROS (+)   Anesthesia Other Findings   Reproductive/Obstetrics negative OB ROS                             Anesthesia Physical Anesthesia Plan  ASA: III  Anesthesia Plan: MAC   Post-op Pain Management:    Induction: Intravenous  PONV Risk Score and Plan: 2 and Treatment may vary due to age or medical condition  Airway Management Planned: Natural Airway and Nasal Cannula  Additional Equipment:   Intra-op Plan:   Post-operative Plan:   Informed Consent: I have reviewed the patients History and Physical, chart, labs and discussed the procedure including the risks, benefits and alternatives for the proposed anesthesia with the patient or authorized representative who has indicated his/her understanding and acceptance.     Dental advisory given  Plan Discussed with: Anesthesiologist and CRNA  Anesthesia Plan Comments:         Anesthesia Quick Evaluation  Patient Active Problem List   Diagnosis Date Noted  . Hip pain 10/27/2015  .  Encounter for screening colonoscopy 06/19/2013    No flowsheet data found. No flowsheet data found.  Risks and benefits of anesthesia discussed at length, patient or surrogate demonstrates understanding. Appropriately NPO. Plan to proceed with anesthesia.  Champ Mungo, MD 11/18/19

## 2019-11-18 NOTE — Transfer of Care (Signed)
Immediate Anesthesia Transfer of Care Note  Patient: Stephanie Peters  Procedure(s) Performed: CATARACT EXTRACTION PHACO AND INTRAOCULAR LENS PLACEMENT (IOC) RIGHT 5.77 00:54.1 10.6% (Right Eye)  Patient Location: PACU  Anesthesia Type: MAC  Level of Consciousness: awake, alert  and patient cooperative  Airway and Oxygen Therapy: Patient Spontanous Breathing and Patient connected to supplemental oxygen  Post-op Assessment: Post-op Vital signs reviewed, Patient's Cardiovascular Status Stable, Respiratory Function Stable, Patent Airway and No signs of Nausea or vomiting  Post-op Vital Signs: Reviewed and stable  Complications: No complications documented.

## 2019-11-18 NOTE — H&P (Signed)

## 2019-11-19 ENCOUNTER — Encounter: Payer: Self-pay | Admitting: Ophthalmology

## 2019-12-09 ENCOUNTER — Other Ambulatory Visit: Payer: Medicare Other

## 2019-12-09 ENCOUNTER — Other Ambulatory Visit: Payer: Self-pay | Admitting: Obstetrics and Gynecology

## 2019-12-10 ENCOUNTER — Inpatient Hospital Stay: Admission: RE | Admit: 2019-12-10 | Payer: Medicare Other | Source: Ambulatory Visit

## 2019-12-14 NOTE — H&P (Signed)
Stephanie Peters is a 69 y.o. female here forFractional D+C and resectionof endometrial polyp  Her for  PMB ( 3 yrs off+ on ) . Pt is on compounded hormones ( estrogen , progesterone and testosterone )  embx :  11/11/19 Diagnosis Synopsis: - [facility] Comment   Comment: Specimen A-Endometrial Biopsy: ENDOMETRIAL HYPERPLASIA  WITHOUT ATYPIA, IN A BACKGROUND OF DISORDERED  PROLIFERATIVE PHASE ENDOMETRIUM WITH BREAKDOWN, AND  FRAGMENTS OF BENIGN ENDOMETRIAL POLYP.  Specimen: - [facility] Comment   Comment: Specimen A-Endometrial Biopsy  DIAGNOSIS: - [facility] Comment   Comment: Specimen A-ENDOMETRIAL HYPERPLASIA WITHOUT ATYPIA, IN A  BACKGROUND OF DISORDERED PROLIFERATIVE PHASE ENDOMETRIUM  WITH BREAKDOWN, AND FRAGMENTS OF BENIGN ENDOMETRIAL POLYP     Past Medical History:  has a past medical history of HSV-2 (herpes simplex virus 2) infection.  Past Surgical History:  has a past surgical history that includes Tubal ligation and Augmentation mammaplasty. Family History: family history includes Other in her father. Social History:  reports that she has never smoked. She has never used smokeless tobacco. She reports previous alcohol use. OB/GYN History:          OB History    Gravida  1   Para  1   Term      Preterm      AB      Living  1     SAB      TAB      Ectopic      Molar      Multiple      Live Births  1          Allergies: is allergic to codeine. Medications:  Current Outpatient Medications:  Marland Kitchen  UNABLE TO FIND, Testosterone 4mg  tabs, Disp: , Rfl:  .  UNABLE TO FIND, Triest 2.25/prog 50MG  SR Cap, Disp: , Rfl:  .  triamcinolone 0.1 % cream, Apply to affected area every day for 1 week then apply to affected area three times weekly from then on (Patient not taking: Reported on 11/11/2019  ), Disp: 30 g, Rfl: 1  Review of Systems: General:                      No fatigue or weight loss Eyes:                           No vision changes Ears:                             No hearing difficulty Respiratory:                No cough or shortness of breath Pulmonary:                  No asthma or shortness of breath Cardiovascular:           No chest pain, palpitations, dyspnea on exertion Gastrointestinal:          No abdominal bloating, chronic diarrhea, constipations, masses, pain or hematochezia Genitourinary:             No hematuria, dysuria, abnormal vaginal discharge, pelvic pain, Menometrorrhagia Lymphatic:                   No swollen lymph nodes Musculoskeletal:         No muscle weakness Neurologic:  No extremity weakness, syncope, seizure disorder Psychiatric:                  No history of depression, delusions or suicidal/homicidal ideation    Exam:      Vitals:   12/14/19 1452  BP: 122/69  Pulse: 73    Body mass index is 24.12 kg/m.  WDWN white/ female in NAD   Lungs: CTA  CV : RRR without murmur   Breast: exam done in sitting and lying position : No dimpling or retraction, no dominant mass, no spontaneous discharge, no axillary adenopathy Neck:  no thyromegaly Abdomen: soft , no mass, normal active bowel sounds,  non-tender, no rebound tenderness Pelvic: tanner stage 5 ,  External genitalia: vulva /labia no lesions Urethra: no prolapse Vagina: normal physiologic d/c Cervix: no lesions, no cervical motion tenderness   Uterus: normal size shape and contour, non-tender Adnexa: no mass,  non-tender   Rectovaginal:  Saline infusion sonohysterography: betadine prep to the cervix followed by placement of the HSG catheter into the endometrial canal . Sterile H2O is injected while performing a transvaginal u/s . 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 A diagnosis of Endometrial polyp was also pertinent to this visit.    Plan:   Recommend Fx D+C to r/o higher grade lesion . myosure resection of polyp .  Most likely she will need progesterone tx .   Benefits and risks to surgery: The proposed benefit of the surgery has been discussed with the patient. The possible risks include, but are not limited to: organ injury to the bowel , bladder, ureters, and major blood vessels and nerves. There is a possibility of additional surgeries resulting from these injuries. There is also the risk of blood transfusion and the need to receive blood products during or after the procedure which may rarely lead to HIV or Hepatitis C infection. There is a risk of developing a deep venous thrombosis or a pulmonary embolism . There is the possibility of wound infection and also anesthetic complications, even the rare possibility of death. The patient understands these risks and wishes to proceed. All questions have been answered No orders of the defined types were placed in this encounter.   Caroline Sauger, MD

## 2019-12-16 ENCOUNTER — Other Ambulatory Visit: Payer: Medicare Other

## 2019-12-22 ENCOUNTER — Encounter
Admission: RE | Admit: 2019-12-22 | Discharge: 2019-12-22 | Disposition: A | Discharge status: Home / Self Care | Payer: Medicare Other | Source: Ambulatory Visit | Attending: Obstetrics and Gynecology | Admitting: Obstetrics and Gynecology

## 2019-12-22 ENCOUNTER — Other Ambulatory Visit: Payer: Self-pay

## 2019-12-22 NOTE — Patient Instructions (Addendum)
Your procedure is scheduled on: 12/28/19 Report to [facility]. To find out your arrival time please call [phone] between 1PM - 3PM 12/25/19 .  Remember: Instructions that are not followed completely may result in serious medical risk, up to and including death, or upon the discretion of your surgeon and anesthesiologist your surgery may need to be rescheduled.     _X__ 1. Do not eat food after midnight the night before your procedure.                 No gum chewing or hard candies. You may drink clear liquids up to 2 hours                 before you are scheduled to arrive for your surgery- DO not drink clear                 liquids within 2 hours of the start of your surgery.                 Clear Liquids include:  water, apple juice without pulp, clear carbohydrate                 drink such as Clearfast or Gatorade, Black Coffee or Tea (Do not add                 anything to coffee or tea). Diabetics water only  THE ENSURE "CLEAR" PRE SURGERY DRINK IS TO BE FINISHED 2 HOURS BEFORE ARRIVING THE DAY OF YOUR SURGERY  __X__2.  On the morning of surgery brush your teeth with toothpaste and water, you                 may rinse your mouth with mouthwash if you wish.  Do not swallow any              toothpaste of mouthwash.     _X__ 3.  No Alcohol for 24 hours before or after surgery.   _X__ 4.  Do Not Smoke or use e-cigarettes For 24 Hours Prior to Your Surgery.                 Do not use any chewable tobacco products for at least 6 hours prior to                 surgery.  ____  5.  Bring all medications with you on the day of surgery if instructed.   __X__  6.  Notify your doctor if there is any change in your medical condition      (cold, fever, infections).     Do not wear jewelry, make-up, hairpins, clips or nail polish. Do not wear lotions, powders, or perfumes.  Do not shave 48 hours prior to surgery. Men may shave face  and neck. Do not bring valuables to the hospital.    [facility] is not responsible for any belongings or valuables.  Contacts, dentures/partials or body piercings may not be worn into surgery. Bring a case for your contacts, glasses or hearing aids, a denture cup will be supplied. Leave your suitcase in the car. After surgery it may be brought to your room. For patients admitted to the hospital, discharge time is determined by your treatment team.   Patients discharged the day of surgery will not be allowed to drive home.   Please read over the following fact sheets that you were given:  MRSA Information  __X__ Take these medicines the morning of surgery with A SIP OF WATER:     1. NONE  2.   3.   4.  5.  6.  ____ Fleet Enema (as directed)   ____ Use CHG Soap/SAGE wipes as directed  ____ Use inhalers on the day of surgery  ____ Stop metformin/Janumet/Farxiga 2 days prior to surgery    ____ Take 1/2 of usual insulin dose the night before surgery. No insulin the morning          of surgery.   ____ Stop Blood Thinners Coumadin/Plavix/Xarelto/Pleta/Pradaxa/Eliquis/Effient/Aspirin  on   Or contact your Surgeon, Cardiologist or Medical Doctor regarding  ability to stop your blood thinners  __X__ Stop Anti-inflammatories 7 days before surgery such as Advil, Ibuprofen, Motrin,  BC or Goodies Powder, Naprosyn, Naproxen, Aleve, Aspirin   YOU MAY TAKE TYLENOL IF NEEDED FOR PAIN  __X__ Stop all herbal supplements, fish oil or vitamin E until after surgery.  STOP GLUCOSAMINE, VITAMIN E & C TODAY 12/22/19  ____ Bring C-Pap to the hospital.

## 2019-12-24 ENCOUNTER — Encounter
Admission: RE | Admit: 2019-12-24 | Discharge: 2019-12-24 | Disposition: A | Discharge status: Home / Self Care | Payer: Medicare Other | Source: Ambulatory Visit | Attending: Obstetrics and Gynecology | Admitting: Obstetrics and Gynecology

## 2019-12-24 ENCOUNTER — Other Ambulatory Visit: Payer: Medicare Other

## 2019-12-24 ENCOUNTER — Other Ambulatory Visit: Payer: Self-pay

## 2019-12-24 DIAGNOSIS — Z01818 Encounter for other preprocedural examination: Secondary | ICD-10-CM | POA: Insufficient documentation

## 2019-12-24 DIAGNOSIS — Z20822 Contact with and (suspected) exposure to covid-19: Secondary | ICD-10-CM | POA: Diagnosis not present

## 2019-12-24 LAB — CBC
HCT: 37.7 % (ref 36.0–46.0)
Hemoglobin: 12.6 g/dL (ref 12.0–15.0)
MCH: 32 pg (ref 26.0–34.0)
MCHC: 33.4 g/dL (ref 30.0–36.0)
MCV: 95.7 fL (ref 80.0–100.0)
Platelets: 208 10*3/uL (ref 150–400)
RBC: 3.94 MIL/uL (ref 3.87–5.11)
RDW: 12.8 % (ref 11.5–15.5)
WBC: 4.5 10*3/uL (ref 4.0–10.5)
nRBC: 0 % (ref 0.0–0.2)

## 2019-12-24 LAB — TYPE AND SCREEN
ABO/RH(D): A POS
Antibody Screen: NEGATIVE

## 2019-12-24 LAB — BASIC METABOLIC PANEL
Anion gap: 9 (ref 5–15)
BUN: 24 mg/dL — ABNORMAL HIGH (ref 8–23)
CO2: 30 mmol/L (ref 22–32)
Calcium: 9 mg/dL (ref 8.9–10.3)
Chloride: 103 mmol/L (ref 98–111)
Creatinine, Ser: 0.53 mg/dL (ref 0.44–1.00)
GFR, Estimated: >60 mL/min (ref >60)
Glucose, Bld: 95 mg/dL (ref 70–99)
Potassium: 4.2 mmol/L (ref 3.5–5.1)
Sodium: 142 mmol/L (ref 135–145)

## 2019-12-24 LAB — SARS CORONAVIRUS 2 (TAT 6-24 HRS): SARS Coronavirus 2: NEGATIVE

## 2019-12-27 MED ORDER — ORAL CARE MOUTH RINSE: 15.0000 mL | Freq: Once | OROMUCOSAL | Status: AC

## 2019-12-27 MED ORDER — LACTATED RINGERS IV SOLN: INTRAVENOUS | Status: DC

## 2019-12-27 MED ORDER — GABAPENTIN 300 MG PO CAPS: 300.0000 mg | ORAL_CAPSULE | ORAL | Status: AC

## 2019-12-27 MED ORDER — FAMOTIDINE 20 MG PO TABS: 20.0000 mg | ORAL_TABLET | Freq: Once | ORAL | Status: AC

## 2019-12-27 MED ORDER — CHLORHEXIDINE GLUCONATE 0.12 % MT SOLN: 15.0000 mL | Freq: Once | OROMUCOSAL | Status: AC

## 2019-12-27 MED ORDER — CEFAZOLIN SODIUM-DEXTROSE 2-4 GM/100ML-% IV SOLN
2.0000 g | Freq: Once | INTRAVENOUS | Status: AC
  Administered 2019-12-28: 2 g via INTRAVENOUS

## 2019-12-27 MED ORDER — ACETAMINOPHEN 500 MG PO TABS: 1000.0000 mg | ORAL_TABLET | ORAL | Status: AC

## 2019-12-27 MED ORDER — POVIDONE-IODINE 10 % EX SWAB: 2.0000 "application " | Freq: Once | CUTANEOUS | Status: DC

## 2019-12-28 ENCOUNTER — Ambulatory Visit
Admission: RE | Admit: 2019-12-28 | Discharge: 2019-12-28 | Disposition: A | Discharge status: Home / Self Care | Payer: Medicare Other | Attending: Obstetrics and Gynecology | Admitting: Obstetrics and Gynecology

## 2019-12-28 ENCOUNTER — Ambulatory Visit: Payer: Medicare Other | Admitting: Certified Registered"

## 2019-12-28 ENCOUNTER — Encounter
Admission: RE | Disposition: A | Discharge status: Home / Self Care | Payer: Self-pay | Source: Home / Self Care | Attending: Obstetrics and Gynecology

## 2019-12-28 ENCOUNTER — Other Ambulatory Visit: Payer: Self-pay

## 2019-12-28 DIAGNOSIS — Z885 Allergy status to narcotic agent status: Secondary | ICD-10-CM | POA: Insufficient documentation

## 2019-12-28 DIAGNOSIS — Z9841 Cataract extraction status, right eye: Secondary | ICD-10-CM | POA: Diagnosis not present

## 2019-12-28 DIAGNOSIS — N840 Polyp of corpus uteri: Secondary | ICD-10-CM | POA: Diagnosis not present

## 2019-12-28 DIAGNOSIS — N950 Postmenopausal bleeding: Secondary | ICD-10-CM | POA: Insufficient documentation

## 2019-12-28 DIAGNOSIS — N8502 Endometrial intraepithelial neoplasia [EIN]: Secondary | ICD-10-CM | POA: Diagnosis present

## 2019-12-28 DIAGNOSIS — Z87891 Personal history of nicotine dependence: Secondary | ICD-10-CM | POA: Diagnosis not present

## 2019-12-28 DIAGNOSIS — Z961 Presence of intraocular lens: Secondary | ICD-10-CM | POA: Insufficient documentation

## 2019-12-28 HISTORY — PX: DILATATION & CURETTAGE/HYSTEROSCOPY WITH MYOSURE: SHX6511

## 2019-12-28 LAB — ABO/RH: ABO/RH(D): A POS

## 2019-12-28 SURGERY — DILATATION & CURETTAGE/HYSTEROSCOPY WITH MYOSURE
Anesthesia: General

## 2019-12-28 MED ORDER — GABAPENTIN 300 MG PO CAPS
ORAL_CAPSULE | ORAL | Status: AC
  Administered 2019-12-28: 300 mg via ORAL
  Filled 2019-12-28: qty 1

## 2019-12-28 MED ORDER — ONDANSETRON HCL 4 MG PO TABS: 8.0000 mg | ORAL_TABLET | Freq: Once | ORAL | Status: DC

## 2019-12-28 MED ORDER — KETOROLAC TROMETHAMINE 30 MG/ML IJ SOLN
INTRAMUSCULAR | Status: DC | PRN
  Administered 2019-12-28: 15 mg via INTRAVENOUS

## 2019-12-28 MED ORDER — LIDOCAINE HCL (CARDIAC) PF 100 MG/5ML IV SOSY
PREFILLED_SYRINGE | INTRAVENOUS | Status: DC | PRN
  Administered 2019-12-28: 60 mg via INTRAVENOUS

## 2019-12-28 MED ORDER — ONDANSETRON HCL 4 MG/2ML IJ SOLN
INTRAMUSCULAR | Status: DC | PRN
  Administered 2019-12-28: 4 mg via INTRAVENOUS

## 2019-12-28 MED ORDER — ACETAMINOPHEN 500 MG PO TABS
ORAL_TABLET | ORAL | Status: AC
  Administered 2019-12-28: 1000 mg via ORAL
  Filled 2019-12-28: qty 2

## 2019-12-28 MED ORDER — CEFAZOLIN SODIUM-DEXTROSE 2-4 GM/100ML-% IV SOLN
INTRAVENOUS | Status: AC
  Filled 2019-12-28: qty 100

## 2019-12-28 MED ORDER — OXYCODONE HCL 5 MG/5ML PO SOLN: 5.0000 mg | Freq: Once | ORAL | Status: DC | PRN

## 2019-12-28 MED ORDER — MIDAZOLAM HCL 2 MG/2ML IJ SOLN
INTRAMUSCULAR | Status: DC | PRN
  Administered 2019-12-28 (×2): 1 mg via INTRAVENOUS

## 2019-12-28 MED ORDER — FENTANYL CITRATE (PF) 100 MCG/2ML IJ SOLN
INTRAMUSCULAR | Status: DC | PRN
  Administered 2019-12-28: 50 ug via INTRAVENOUS
  Administered 2019-12-28 (×2): 25 ug via INTRAVENOUS

## 2019-12-28 MED ORDER — FENTANYL CITRATE (PF) 100 MCG/2ML IJ SOLN: 25.0000 ug | INTRAMUSCULAR | Status: DC | PRN

## 2019-12-28 MED ORDER — KETOROLAC TROMETHAMINE 30 MG/ML IJ SOLN
INTRAMUSCULAR | Status: AC
  Filled 2019-12-28: qty 1

## 2019-12-28 MED ORDER — FENTANYL CITRATE (PF) 100 MCG/2ML IJ SOLN
INTRAMUSCULAR | Status: AC
  Filled 2019-12-28: qty 2

## 2019-12-28 MED ORDER — CHLORHEXIDINE GLUCONATE 0.12 % MT SOLN
OROMUCOSAL | Status: AC
  Administered 2019-12-28: 15 mL via OROMUCOSAL
  Filled 2019-12-28: qty 15

## 2019-12-28 MED ORDER — OXYCODONE HCL 5 MG PO TABS: 5.0000 mg | ORAL_TABLET | Freq: Once | ORAL | Status: DC | PRN

## 2019-12-28 MED ORDER — SILVER NITRATE-POT NITRATE 75-25 % EX MISC
CUTANEOUS | Status: AC
  Filled 2019-12-28: qty 10

## 2019-12-28 MED ORDER — PROPOFOL 10 MG/ML IV BOLUS
INTRAVENOUS | Status: AC
  Filled 2019-12-28: qty 20

## 2019-12-28 MED ORDER — PROPOFOL 10 MG/ML IV BOLUS
INTRAVENOUS | Status: DC | PRN
  Administered 2019-12-28: 120 mg via INTRAVENOUS

## 2019-12-28 MED ORDER — ONDANSETRON HCL 4 MG/2ML IJ SOLN
INTRAMUSCULAR | Status: AC
  Filled 2019-12-28: qty 2

## 2019-12-28 MED ORDER — DEXMEDETOMIDINE (PRECEDEX) IN NS 20 MCG/5ML (4 MCG/ML) IV SYRINGE
PREFILLED_SYRINGE | INTRAVENOUS | Status: AC
  Filled 2019-12-28: qty 5

## 2019-12-28 MED ORDER — MIDAZOLAM HCL 2 MG/2ML IJ SOLN
INTRAMUSCULAR | Status: AC
  Filled 2019-12-28: qty 2

## 2019-12-28 MED ORDER — DEXAMETHASONE SODIUM PHOSPHATE 10 MG/ML IJ SOLN
INTRAMUSCULAR | Status: DC | PRN
  Administered 2019-12-28: 10 mg via INTRAVENOUS

## 2019-12-28 MED ORDER — LIDOCAINE HCL (PF) 2 % IJ SOLN
INTRAMUSCULAR | Status: AC
  Filled 2019-12-28: qty 10

## 2019-12-28 MED ORDER — HYDROCODONE-ACETAMINOPHEN 5-325 MG PO TABS: 1.0000 | ORAL_TABLET | ORAL | Status: DC | PRN

## 2019-12-28 MED ORDER — ONDANSETRON HCL 4 MG/2ML IJ SOLN: 4.0000 mg | Freq: Once | INTRAMUSCULAR | Status: DC | PRN

## 2019-12-28 MED ORDER — FAMOTIDINE 20 MG PO TABS
ORAL_TABLET | ORAL | Status: AC
  Administered 2019-12-28: 20 mg via ORAL
  Filled 2019-12-28: qty 1

## 2019-12-28 MED ORDER — DEXAMETHASONE SODIUM PHOSPHATE 10 MG/ML IJ SOLN
INTRAMUSCULAR | Status: AC
  Filled 2019-12-28: qty 1

## 2019-12-28 SURGICAL SUPPLY — 25 items
BAG INFUSER PRESSURE 100CC (MISCELLANEOUS) ×3 IMPLANT
CANISTER SUCT 3000ML PPV (MISCELLANEOUS) ×3 IMPLANT
CATH ROBINSON RED A/P 16FR (CATHETERS) IMPLANT
COVER WAND RF STERILE (DRAPES) IMPLANT
DEVICE MYOSURE LITE (MISCELLANEOUS) IMPLANT
DEVICE MYOSURE REACH (MISCELLANEOUS) IMPLANT
GLOVE SURG SYN 8.0 (GLOVE) ×3 IMPLANT
GOWN STRL REUS W/ TWL LRG LVL3 (GOWN DISPOSABLE) ×1 IMPLANT
GOWN STRL REUS W/ TWL XL LVL3 (GOWN DISPOSABLE) ×1 IMPLANT
GOWN STRL REUS W/TWL LRG LVL3 (GOWN DISPOSABLE) ×3
GOWN STRL REUS W/TWL XL LVL3 (GOWN DISPOSABLE) ×3
IV NS 1000ML (IV SOLUTION) ×3
IV NS 1000ML BAXH (IV SOLUTION) ×1 IMPLANT
KIT PROCEDURE FLUENT (KITS) ×3 IMPLANT
KIT TURNOVER CYSTO (KITS) ×3 IMPLANT
PACK DNC HYST (MISCELLANEOUS) IMPLANT
PAD OB MATERNITY 4.3X12.25 (PERSONAL CARE ITEMS) ×3 IMPLANT
PAD PREP 24X41 OB/GYN DISP (PERSONAL CARE ITEMS) ×3 IMPLANT
SEAL ROD LENS SCOPE MYOSURE (ABLATOR) ×3 IMPLANT
SOL .9 NS 3000ML IRR  AL (IV SOLUTION) ×2
SOL .9 NS 3000ML IRR AL (IV SOLUTION) ×1
SOL .9 NS 3000ML IRR UROMATIC (IV SOLUTION) ×1 IMPLANT
TOWEL OR 17X26 4PK STRL BLUE (TOWEL DISPOSABLE) ×3 IMPLANT
TUBING CONNECTING 10 (TUBING) IMPLANT
TUBING CONNECTING 10' (TUBING)

## 2019-12-28 NOTE — Brief Op Note (Signed)
12/28/2019  10:32 AM  PATIENT:  Stephanie Peters  69 y.o. female  PRE-OPERATIVE DIAGNOSIS:  Endometrial Intraepithelial Neoplasia, endomtrial polyp  POST-OPERATIVE DIAGNOSIS: EIN   PROCEDURE: Fractional dilation and curettage  Hysteroscopy   SURGEON:  Surgeon(s) and Role:    * Kazuo Durnil, Gwen Her, MD - Primary  PHYSICIAN ASSISTANT:   ASSISTANTS: none   ANESTHESIA:   general  EBL:  5 mL , IOF 600 cc, UO 50 cc  BLOOD ADMINISTERED:none  DRAINS: none   LOCAL MEDICATIONS USED:  NONE  SPECIMEN:  Source of Specimen:  ECC, Endometrial curettings   DISPOSITION OF SPECIMEN:  PATHOLOGY  COUNTS:  YES  TOURNIQUET:  * No tourniquets in log *  DICTATION: .Other Dictation: Dictation Number verbal  PLAN OF CARE: Discharge to home after PACU  PATIENT DISPOSITION:  PACU - hemodynamically stable.   Delay start of Pharmacological VTE agent (>24hrs) due to surgical blood loss or risk of bleeding: not applicable

## 2019-12-28 NOTE — Anesthesia Procedure Notes (Signed)
Procedure Name: LMA Insertion Date/Time: 12/28/2019 10:00 AM Performed by: Lerry Liner, CRNA Pre-anesthesia Checklist: Patient identified, Emergency Drugs available, Suction available and Patient being monitored Patient Re-evaluated:Patient Re-evaluated prior to induction Oxygen Delivery Method: Circle system utilized Preoxygenation: Pre-oxygenation with 100% oxygen Induction Type: IV induction Ventilation: Mask ventilation without difficulty LMA: LMA inserted LMA Size: 3.5 Number of attempts: 1 Placement Confirmation: ETT inserted through vocal cords under direct vision,  positive ETCO2 and breath sounds checked- equal and bilateral Tube secured with: Tape Dental Injury: Teeth and Oropharynx as per pre-operative assessment

## 2019-12-28 NOTE — Discharge Instructions (Signed)

## 2019-12-28 NOTE — Op Note (Signed)
NAMEELAIN, [name] MEDICAL RECORD [id number] ACCOUNT [id number] DATE OF BIRTH:02/13/1951 FACILITY: [facility] LOCATION: [facility] PHYSICIAN:Corderius Saraceni Josefine Class, MD  OPERATIVE REPORT  DATE OF PROCEDURE:  12/28/2019  PREOPERATIVE DIAGNOSES: 1.  Postmenopausal bleeding. 2.  Endometrial intraepithelial neoplasia.  POSTOPERATIVE DIAGNOSES:   1.  Postmenopausal bleeding.   2.  Endometrial intraepithelial neoplasia.  PROCEDURES: 1.  Fractional dilation and curettage. 2.  Hysteroscopy.  SURGEON:  Laverta Baltimore, MD  ANESTHESIA:  General endotracheal anesthesia.  INDICATIONS:  A 69 year old gravida 1, para 1 patient with postmenopausal bleeding who underwent office endometrial biopsy that showed endometrial intraepithelial neoplasia without atypia.  On saline infusion sonohysterography, there was evidence of an 8  mm endometrial polyp.  DESCRIPTION OF PROCEDURE:  After adequate general endotracheal anesthesia, the patient was placed in dorsal supine position with the legs in the candy cane stirrups.  The patient's abdomen, perineum and vagina were prepped and draped in normal sterile  fashion.  The patient did receive prophylactic antibiotics.  Timeout was performed.  Straight catheterization of the bladder yielded 50 mL of clear urine.  A weighted speculum was placed in the posterior vaginal vault and the anterior cervix was grasped  with a single-tooth tenaculum.  An endocervical curettage was performed, followed by uterine sounding to 7 cm.  Cervix was dilated to #18 Hanks dilator and the hysteroscope was advanced into the endometrial cavity with normal saline as distending medium.   No endometrial pathology was identified.  Pictures were taken.  Hysteroscope was removed and a thorough endometrial curettage was performed with scant tissue.  Good hemostasis was noted.  Silver nitrate was applied to the anterior tenaculum site.  Good  hemostasis.  Procedure was terminated.   The patient did receive 15 mg intravenous Toradol at the end of the case.  INTRAOPERATIVE FLUIDS:  600 mL.  URINE OUTPUT:  50 mL.  ESTIMATED BLOOD LOSS:  Minimal.  DISPOSITION:  The patient was taken to recovery room in good condition.  VN/NUANCE  D:12/28/2019 T:12/28/2019 JOB:[id number]

## 2019-12-28 NOTE — Transfer of Care (Signed)
Immediate Anesthesia Transfer of Care Note  Patient: Stephanie Peters  Procedure(s) Performed: FRACTIONAL DILATATION & CURETTAGE/HYSTEROSCOPY WITH MYOSURE RESECTION OF POLYP (N/A )  Patient Location: PACU  Anesthesia Type:General  Level of Consciousness: drowsy  Airway & Oxygen Therapy: Patient Spontanous Breathing and Patient connected to face mask oxygen  Post-op Assessment: Report given to RN  Post vital signs: stable  Last Vitals:  Vitals Value Taken Time  BP    Temp    Pulse 73 12/28/19 1041  Resp    SpO2 100 % 12/28/19 1041  Vitals shown include unvalidated device data.  Last Pain:  Vitals:   12/28/19 0824  TempSrc: Tympanic  PainSc: 0-No pain         Complications: No complications documented.

## 2019-12-28 NOTE — Progress Notes (Signed)
Ready for Fx D+C . Labs reviewed . NPO  All questions answered . Proceed

## 2019-12-28 NOTE — Anesthesia Preprocedure Evaluation (Addendum)
Anesthesia Evaluation  Patient identified by MRN, date of birth, ID band Patient awake    Reviewed: Allergy & Precautions, H&P , NPO status , Patient's Chart, lab work & pertinent test results  History of Anesthesia Complications Negative for: history of anesthetic complications  Airway Mallampati: II  TM Distance: >3 FB Neck ROM: full    Dental  (+) Teeth Intact   Pulmonary neg sleep apnea, neg COPD, former smoker,    breath sounds clear to auscultation       Cardiovascular (-) angina(-) Past MI and (-) Cardiac Stents negative cardio ROS  (-) dysrhythmias  Rhythm:regular Rate:Normal     Neuro/Psych negative neurological ROS  negative psych ROS   GI/Hepatic negative GI ROS, Neg liver ROS,   Endo/Other  negative endocrine ROS  Renal/GU      Musculoskeletal   Abdominal   Peds  Hematology negative hematology ROS (+)   Anesthesia Other Findings No past medical history on file.  Past Surgical History: 1979: AUGMENTATION MAMMAPLASTY; Bilateral     Comment:  silicone 2025: AUGMENTATION MAMMAPLASTY; Bilateral     Comment:  replaced with saline 2008: BREAST SURGERY     Comment:  breast implants 11/18/2019: CATARACT EXTRACTION W/PHACO; Right     Comment:  Procedure: CATARACT EXTRACTION PHACO AND INTRAOCULAR               LENS PLACEMENT (IOC) RIGHT 5.77 00:54.1 10.6%;  Surgeon:               Leandrew Koyanagi, MD;  Location: [place];              Service: Ophthalmology;  Laterality: Right;  requests               early No date: TONSILLECTOMY No date: TUBAL LIGATION  BMI    Body Mass Index: 23.49 kg/m      Reproductive/Obstetrics negative OB ROS                            Anesthesia Physical Anesthesia Plan  ASA: I  Anesthesia Plan: General LMA   Post-op Pain Management:    Induction:   PONV Risk Score and Plan: Dexamethasone, Ondansetron, Midazolam and Treatment  may vary due to age or medical condition  Airway Management Planned:   Additional Equipment:   Intra-op Plan:   Post-operative Plan:   Informed Consent: I have reviewed the patients History and Physical, chart, labs and discussed the procedure including the risks, benefits and alternatives for the proposed anesthesia with the patient or authorized representative who has indicated his/her understanding and acceptance.     Dental Advisory Given  Plan Discussed with: Anesthesiologist, CRNA and Surgeon  Anesthesia Plan Comments:         Anesthesia Quick Evaluation

## 2019-12-29 ENCOUNTER — Encounter: Payer: Self-pay | Admitting: Obstetrics and Gynecology

## 2019-12-29 LAB — SURGICAL PATHOLOGY

## 2019-12-29 NOTE — Anesthesia Postprocedure Evaluation (Signed)
Anesthesia Post Note  Patient: Stephanie Peters  Procedure(s) Performed: FRACTIONAL DILATATION & CURETTAGE/HYSTEROSCOPY WITH MYOSURE RESECTION OF POLYP (N/A )  Patient location during evaluation: PACU Anesthesia Type: General Level of consciousness: awake and alert Pain management: pain level controlled Vital Signs Assessment: post-procedure vital signs reviewed and stable Respiratory status: spontaneous breathing, nonlabored ventilation and respiratory function stable Cardiovascular status: blood pressure returned to baseline and stable Postop Assessment: no apparent nausea or vomiting Anesthetic complications: no   No complications documented.   Last Vitals:  Vitals:   12/28/19 1111 12/28/19 1126  BP: (!) 110/56 (!) 127/49  Pulse: (!) 59 61  Resp: 13 18  Temp: (!) 36.2 C 36.4 C  SpO2: 99% 99%    Last Pain:  Vitals:   12/28/19 1126  TempSrc: Temporal  PainSc: 0-No pain                 Brett Canales Nashya Garlington

## 2020-01-12 ENCOUNTER — Encounter: Payer: Self-pay | Admitting: Ophthalmology

## 2020-01-18 ENCOUNTER — Other Ambulatory Visit: Payer: Self-pay

## 2020-01-18 ENCOUNTER — Other Ambulatory Visit
Admission: RE | Admit: 2020-01-18 | Discharge: 2020-01-18 | Disposition: A | Discharge status: Home / Self Care | Payer: Medicare Other | Source: Ambulatory Visit | Attending: Ophthalmology | Admitting: Ophthalmology

## 2020-01-18 DIAGNOSIS — Z20822 Contact with and (suspected) exposure to covid-19: Secondary | ICD-10-CM | POA: Insufficient documentation

## 2020-01-18 DIAGNOSIS — Z01812 Encounter for preprocedural laboratory examination: Secondary | ICD-10-CM | POA: Insufficient documentation

## 2020-01-18 NOTE — Discharge Instructions (Signed)

## 2020-01-19 LAB — SARS CORONAVIRUS 2 (TAT 6-24 HRS): SARS Coronavirus 2: NEGATIVE

## 2020-01-20 ENCOUNTER — Other Ambulatory Visit: Payer: Self-pay

## 2020-01-20 ENCOUNTER — Ambulatory Visit
Admission: RE | Admit: 2020-01-20 | Discharge: 2020-01-20 | Disposition: A | Discharge status: Home / Self Care | Payer: Medicare Other | Attending: Ophthalmology | Admitting: Ophthalmology

## 2020-01-20 ENCOUNTER — Encounter: Payer: Self-pay | Admitting: Ophthalmology

## 2020-01-20 ENCOUNTER — Ambulatory Visit: Payer: Medicare Other | Admitting: Anesthesiology

## 2020-01-20 ENCOUNTER — Encounter
Admission: RE | Disposition: A | Discharge status: Home / Self Care | Payer: Self-pay | Source: Home / Self Care | Attending: Ophthalmology

## 2020-01-20 DIAGNOSIS — Z79899 Other long term (current) drug therapy: Secondary | ICD-10-CM | POA: Diagnosis not present

## 2020-01-20 DIAGNOSIS — H2512 Age-related nuclear cataract, left eye: Secondary | ICD-10-CM | POA: Insufficient documentation

## 2020-01-20 DIAGNOSIS — Z885 Allergy status to narcotic agent status: Secondary | ICD-10-CM | POA: Insufficient documentation

## 2020-01-20 HISTORY — PX: CATARACT EXTRACTION W/PHACO: SHX586

## 2020-01-20 SURGERY — PHACOEMULSIFICATION, CATARACT, WITH IOL INSERTION
Anesthesia: Monitor Anesthesia Care | Site: Eye | Laterality: Left

## 2020-01-20 MED ORDER — CEFUROXIME OPHTHALMIC INJECTION 1 MG/0.1 ML
INJECTION | OPHTHALMIC | Status: DC | PRN
  Administered 2020-01-20: 0.1 mL via INTRACAMERAL

## 2020-01-20 MED ORDER — TETRACAINE HCL 0.5 % OP SOLN
1.0000 [drp] | OPHTHALMIC | Status: DC | PRN
  Administered 2020-01-20 (×3): 1 [drp] via OPHTHALMIC

## 2020-01-20 MED ORDER — ARMC OPHTHALMIC DILATING DROPS
1.0000 "application " | OPHTHALMIC | Status: DC | PRN
  Administered 2020-01-20 (×3): 1 via OPHTHALMIC

## 2020-01-20 MED ORDER — LIDOCAINE HCL (PF) 2 % IJ SOLN
INTRAOCULAR | Status: DC | PRN
  Administered 2020-01-20: 1 mL

## 2020-01-20 MED ORDER — EPINEPHRINE PF 1 MG/ML IJ SOLN
INTRAOCULAR | Status: DC | PRN
  Administered 2020-01-20: 57 mL via OPHTHALMIC

## 2020-01-20 MED ORDER — FENTANYL CITRATE (PF) 100 MCG/2ML IJ SOLN
INTRAMUSCULAR | Status: DC | PRN
  Administered 2020-01-20: 100 ug via INTRAVENOUS

## 2020-01-20 MED ORDER — MIDAZOLAM HCL 2 MG/2ML IJ SOLN
INTRAMUSCULAR | Status: DC | PRN
  Administered 2020-01-20: 2 mg via INTRAVENOUS

## 2020-01-20 MED ORDER — ACETAMINOPHEN 160 MG/5ML PO SOLN: 325.0000 mg | Freq: Once | ORAL | Status: DC

## 2020-01-20 MED ORDER — MOXIFLOXACIN HCL 0.5 % OP SOLN: 1.0000 [drp] | OPHTHALMIC | Status: DC | PRN

## 2020-01-20 MED ORDER — NA HYALUR & NA CHOND-NA HYALUR 0.4-0.35 ML IO KIT
PACK | INTRAOCULAR | Status: DC | PRN
  Administered 2020-01-20: 1 mL via INTRAOCULAR

## 2020-01-20 MED ORDER — LACTATED RINGERS IV SOLN: INTRAVENOUS | Status: DC

## 2020-01-20 MED ORDER — ACETAMINOPHEN 325 MG PO TABS: 325.0000 mg | ORAL_TABLET | Freq: Once | ORAL | Status: DC

## 2020-01-20 MED ORDER — BRIMONIDINE TARTRATE-TIMOLOL 0.2-0.5 % OP SOLN
OPHTHALMIC | Status: DC | PRN
  Administered 2020-01-20: 1 [drp] via OPHTHALMIC

## 2020-01-20 SURGICAL SUPPLY — 23 items
CANNULA ANT/CHMB 27G (MISCELLANEOUS) ×1 IMPLANT
CANNULA ANT/CHMB 27GA (MISCELLANEOUS) ×3 IMPLANT
GLOVE SURG LX 7.5 STRW (GLOVE) ×2
GLOVE SURG LX STRL 7.5 STRW (GLOVE) ×1 IMPLANT
GLOVE SURG TRIUMPH 8.0 PF LTX (GLOVE) ×3 IMPLANT
GOWN STRL REUS W/ TWL LRG LVL3 (GOWN DISPOSABLE) ×2 IMPLANT
GOWN STRL REUS W/TWL LRG LVL3 (GOWN DISPOSABLE) ×6
LENS IOL DIOP 19.0 (Intraocular Lens) ×3 IMPLANT
LENS IOL TECNIS MONO 19.0 (Intraocular Lens) IMPLANT
MARKER SKIN DUAL TIP RULER LAB (MISCELLANEOUS) ×3 IMPLANT
NDL CAPSULORHEX 25GA (NEEDLE) ×1 IMPLANT
NDL FILTER BLUNT 18X1 1/2 (NEEDLE) ×2 IMPLANT
NEEDLE CAPSULORHEX 25GA (NEEDLE) ×3 IMPLANT
NEEDLE FILTER BLUNT 18X 1/2SAF (NEEDLE) ×4
NEEDLE FILTER BLUNT 18X1 1/2 (NEEDLE) ×2 IMPLANT
PACK CATARACT BRASINGTON (MISCELLANEOUS) ×3 IMPLANT
PACK EYE AFTER SURG (MISCELLANEOUS) ×3 IMPLANT
PACK OPTHALMIC (MISCELLANEOUS) ×3 IMPLANT
SOLUTION OPHTHALMIC SALT (MISCELLANEOUS) ×3 IMPLANT
SYR 3ML LL SCALE MARK (SYRINGE) ×6 IMPLANT
SYR TB 1ML LUER SLIP (SYRINGE) ×3 IMPLANT
WATER STERILE IRR 250ML POUR (IV SOLUTION) ×3 IMPLANT
WIPE NON LINTING 3.25X3.25 (MISCELLANEOUS) ×3 IMPLANT

## 2020-01-20 NOTE — Anesthesia Postprocedure Evaluation (Signed)
Anesthesia Post Note  Patient: Stephanie Peters  Procedure(s) Performed: CATARACT EXTRACTION PHACO AND INTRAOCULAR LENS PLACEMENT (IOC) LEFT 4.01 00:36.8 10.9% (Left Eye)     Patient location during evaluation: PACU Anesthesia Type: MAC Level of consciousness: awake and alert and oriented Pain management: satisfactory to patient Vital Signs Assessment: post-procedure vital signs reviewed and stable Respiratory status: spontaneous breathing, nonlabored ventilation and respiratory function stable Cardiovascular status: blood pressure returned to baseline and stable Postop Assessment: Adequate PO intake and No signs of nausea or vomiting Anesthetic complications: no   No complications documented.  Raliegh Ip

## 2020-01-20 NOTE — Transfer of Care (Signed)
Immediate Anesthesia Transfer of Care Note  Patient: Stephanie Peters  Procedure(s) Performed: CATARACT EXTRACTION PHACO AND INTRAOCULAR LENS PLACEMENT (IOC) LEFT 4.01 00:36.8 10.9% (Left Eye)  Patient Location: PACU  Anesthesia Type: MAC  Level of Consciousness: awake, alert  and patient cooperative  Airway and Oxygen Therapy: Patient Spontanous Breathing and Patient connected to supplemental oxygen  Post-op Assessment: Post-op Vital signs reviewed, Patient's Cardiovascular Status Stable, Respiratory Function Stable, Patent Airway and No signs of Nausea or vomiting  Post-op Vital Signs: Reviewed and stable  Complications: No complications documented.

## 2020-01-20 NOTE — Anesthesia Preprocedure Evaluation (Signed)
Anesthesia Evaluation  Patient identified by MRN, date of birth, ID band Patient awake    Reviewed: Allergy & Precautions, H&P , NPO status , Patient's Chart, lab work & pertinent test results  Airway Mallampati: II  TM Distance: >3 FB Neck ROM: full    Dental no notable dental hx.    Pulmonary former smoker,    Pulmonary exam normal breath sounds clear to auscultation       Cardiovascular Normal cardiovascular exam Rhythm:regular Rate:Normal     Neuro/Psych    GI/Hepatic   Endo/Other    Renal/GU      Musculoskeletal   Abdominal   Peds  Hematology   Anesthesia Other Findings   Reproductive/Obstetrics                             Anesthesia Physical Anesthesia Plan  ASA: I  Anesthesia Plan: MAC   Post-op Pain Management:    Induction:   PONV Risk Score and Plan: 2 and Treatment may vary due to age or medical condition, TIVA and Midazolam  Airway Management Planned:   Additional Equipment:   Intra-op Plan:   Post-operative Plan:   Informed Consent: I have reviewed the patients History and Physical, chart, labs and discussed the procedure including the risks, benefits and alternatives for the proposed anesthesia with the patient or authorized representative who has indicated his/her understanding and acceptance.     Dental Advisory Given  Plan Discussed with: CRNA  Anesthesia Plan Comments:         Anesthesia Quick Evaluation

## 2020-01-20 NOTE — H&P (Signed)

## 2020-01-20 NOTE — Anesthesia Procedure Notes (Signed)
Procedure Name: MAC Date/Time: 01/20/2020 1:24 PM Performed by: Silvana Newness, CRNA Pre-anesthesia Checklist: Patient identified, Emergency Drugs available, Suction available, Patient being monitored and Timeout performed Patient Re-evaluated:Patient Re-evaluated prior to induction Oxygen Delivery Method: Nasal cannula Placement Confirmation: positive ETCO2

## 2020-01-20 NOTE — Op Note (Signed)
OPERATIVE NOTE  JACALYN BIGGS [id number] 01/20/2020   PREOPERATIVE DIAGNOSIS:  Nuclear sclerotic cataract left eye. H25.12   POSTOPERATIVE DIAGNOSIS:    Nuclear sclerotic cataract left eye.     PROCEDURE:  Phacoemusification with posterior chamber intraocular lens placement of the left eye  Ultrasound time: Procedure(s): CATARACT EXTRACTION PHACO AND INTRAOCULAR LENS PLACEMENT (IOC) LEFT 4.01 00:36.8 10.9% (Left)  LENS:   Implant Name Type Inv. Item Serial No. Manufacturer Lot No. LRB No. Used Action  LENS IOL DIOP 19.0 - [id number] Intraocular Lens LENS IOL DIOP 19.0 [id number] JOHNSON   Left 1 Implanted      SURGEON:  Wyonia Hough, MD   ANESTHESIA:  Topical with tetracaine drops and 2% Xylocaine jelly, augmented with 1% preservative-free intracameral lidocaine.    COMPLICATIONS:  None.   DESCRIPTION OF PROCEDURE:  The patient was identified in the holding room and transported to the operating room and placed in the supine position under the operating microscope.  The left eye was identified as the operative eye and it was prepped and draped in the usual sterile ophthalmic fashion.   A 1 millimeter clear-corneal paracentesis was made at the 1:30 position.  0.5 ml of preservative-free 1% lidocaine was injected into the anterior chamber.  The anterior chamber was filled with Viscoat viscoelastic.  A 2.4 millimeter keratome was used to make a near-clear corneal incision at the 10:30 position.  .  A curvilinear capsulorrhexis was made with a cystotome and capsulorrhexis forceps.  Balanced salt solution was used to hydrodissect and hydrodelineate the nucleus.   Phacoemulsification was then used in stop and chop fashion to remove the lens nucleus and epinucleus.  The remaining cortex was then removed using the irrigation and aspiration handpiece. Provisc was then placed into the capsular bag to distend it for lens placement.  A lens was then injected into the capsular bag.   The remaining viscoelastic was aspirated.   Wounds were hydrated with balanced salt solution.  The anterior chamber was inflated to a physiologic pressure with balanced salt solution.  No wound leaks were noted. Cefuroxime 0.1 ml of a 10mg /ml solution was injected into the anterior chamber for a dose of 1 mg of intracameral antibiotic at the completion of the case.   Timolol and Brimonidine drops were applied to the eye.  The patient was taken to the recovery room in stable condition without complications of anesthesia or surgery.  Jaslynn Thome 01/20/2020, 1:36 PM

## 2020-01-21 ENCOUNTER — Encounter: Payer: Self-pay | Admitting: Ophthalmology

## 2020-02-08 ENCOUNTER — Other Ambulatory Visit: Payer: Self-pay | Admitting: Internal Medicine

## 2020-02-08 DIAGNOSIS — Z1231 Encounter for screening mammogram for malignant neoplasm of breast: Secondary | ICD-10-CM

## 2020-03-30 ENCOUNTER — Other Ambulatory Visit: Payer: Self-pay

## 2020-03-30 ENCOUNTER — Ambulatory Visit
Admission: RE | Admit: 2020-03-30 | Discharge: 2020-03-30 | Disposition: A | Discharge status: Home / Self Care | Payer: Medicare Other | Source: Ambulatory Visit | Attending: Internal Medicine | Admitting: Internal Medicine

## 2020-03-30 DIAGNOSIS — Z1231 Encounter for screening mammogram for malignant neoplasm of breast: Secondary | ICD-10-CM | POA: Insufficient documentation

## 2021-02-14 DIAGNOSIS — Z85820 Personal history of malignant melanoma of skin: Secondary | ICD-10-CM | POA: Insufficient documentation

## 2021-02-15 ENCOUNTER — Other Ambulatory Visit: Payer: Self-pay

## 2021-02-15 ENCOUNTER — Ambulatory Visit (INDEPENDENT_AMBULATORY_CARE_PROVIDER_SITE_OTHER): Payer: Medicare Other

## 2021-02-15 ENCOUNTER — Ambulatory Visit (INDEPENDENT_AMBULATORY_CARE_PROVIDER_SITE_OTHER): Payer: Medicare Other | Admitting: Podiatry

## 2021-02-15 ENCOUNTER — Other Ambulatory Visit: Payer: Self-pay | Admitting: Podiatry

## 2021-02-15 ENCOUNTER — Encounter: Payer: Self-pay | Admitting: Podiatry

## 2021-02-15 DIAGNOSIS — N950 Postmenopausal bleeding: Secondary | ICD-10-CM | POA: Insufficient documentation

## 2021-02-15 DIAGNOSIS — M722 Plantar fascial fibromatosis: Secondary | ICD-10-CM

## 2021-02-15 DIAGNOSIS — M778 Other enthesopathies, not elsewhere classified: Secondary | ICD-10-CM

## 2021-02-15 MED ORDER — TRIAMCINOLONE ACETONIDE 40 MG/ML IJ SUSP
20.0000 mg | Freq: Once | INTRAMUSCULAR | Status: AC
  Administered 2021-02-15: 16:00:00 20 mg

## 2021-02-15 MED ORDER — MELOXICAM 15 MG PO TABS: 15.0000 mg | ORAL_TABLET | Freq: Every day | ORAL | 3 refills | Status: AC

## 2021-02-15 NOTE — Progress Notes (Signed)
She presents today chief complaint of plantar heel pain left.  States that was hurting severely few weeks ago now it seems to be doing better she is an avid pickleball player mornings are particularly bad and after she has been playing.  She has recently purchased some soft shoes with cushion insoles which really have not provided a lot of relief.  Objective: Vital signs are stable alert and oriented x3.  Pulses are palpable.  There is no erythema edema cellulitis drainage or odor she has pain on palpation medial calcaneal tubercle of the left heel.  Radiographs taken today demonstrate significant soft tissue increase in density at the plantar fashion calcaneal insertion site indicative of acute Planter fasciitis.  Assessment: Acute Planter fasciitis.  Plan: Injected the area today 20 mg Kenalog 5 mg Marcaine point of maximal tenderness.  She already has an ankle brace that she wears.  We did discuss appropriate shoe gear stretching exercise ice therapy and shoe gear modifications.  I also recommended the use of meloxicam 15 mg 1 p.o. daily and I will follow-up with her in 1 month.

## 2021-03-07 ENCOUNTER — Other Ambulatory Visit: Payer: Self-pay | Admitting: Internal Medicine

## 2021-03-07 DIAGNOSIS — E2839 Other primary ovarian failure: Secondary | ICD-10-CM

## 2021-03-07 DIAGNOSIS — M8580 Other specified disorders of bone density and structure, unspecified site: Secondary | ICD-10-CM

## 2021-03-07 DIAGNOSIS — Z1231 Encounter for screening mammogram for malignant neoplasm of breast: Secondary | ICD-10-CM

## 2021-04-11 ENCOUNTER — Other Ambulatory Visit: Payer: Self-pay

## 2021-04-11 ENCOUNTER — Ambulatory Visit
Admission: RE | Admit: 2021-04-11 | Discharge: 2021-04-11 | Disposition: A | Discharge status: Home / Self Care | Payer: Medicare Other | Source: Ambulatory Visit | Attending: Internal Medicine | Admitting: Internal Medicine

## 2021-04-11 DIAGNOSIS — E2839 Other primary ovarian failure: Secondary | ICD-10-CM | POA: Insufficient documentation

## 2021-05-03 ENCOUNTER — Ambulatory Visit
Admission: RE | Admit: 2021-05-03 | Discharge: 2021-05-03 | Disposition: A | Discharge status: Home / Self Care | Payer: Medicare Other | Source: Ambulatory Visit | Attending: Internal Medicine | Admitting: Internal Medicine

## 2021-05-03 ENCOUNTER — Other Ambulatory Visit: Payer: Self-pay

## 2021-05-03 DIAGNOSIS — Z1231 Encounter for screening mammogram for malignant neoplasm of breast: Secondary | ICD-10-CM | POA: Diagnosis not present

## 2022-06-18 ENCOUNTER — Other Ambulatory Visit: Payer: Self-pay | Admitting: Internal Medicine

## 2022-06-18 DIAGNOSIS — Z1231 Encounter for screening mammogram for malignant neoplasm of breast: Secondary | ICD-10-CM

## 2022-07-11 ENCOUNTER — Ambulatory Visit
Admission: RE | Admit: 2022-07-11 | Discharge: 2022-07-11 | Disposition: A | Discharge status: Home / Self Care | Payer: Medicare Other | Source: Ambulatory Visit | Attending: Internal Medicine | Admitting: Internal Medicine

## 2022-07-11 DIAGNOSIS — Z1231 Encounter for screening mammogram for malignant neoplasm of breast: Secondary | ICD-10-CM | POA: Diagnosis not present

## 2022-07-13 ENCOUNTER — Other Ambulatory Visit: Payer: Self-pay | Admitting: Internal Medicine

## 2022-07-13 DIAGNOSIS — R921 Mammographic calcification found on diagnostic imaging of breast: Secondary | ICD-10-CM

## 2022-07-16 ENCOUNTER — Ambulatory Visit
Admission: RE | Admit: 2022-07-16 | Discharge: 2022-07-16 | Disposition: A | Discharge status: Home / Self Care | Payer: Medicare Other | Source: Ambulatory Visit | Attending: Internal Medicine | Admitting: Internal Medicine

## 2022-07-16 DIAGNOSIS — R921 Mammographic calcification found on diagnostic imaging of breast: Secondary | ICD-10-CM | POA: Insufficient documentation

## 2022-11-12 IMAGING — MG DIGITAL SCREENING BREAST BILAT IMPLANT W/ TOMO W/ CAD
8 of 12 series · 8 of 28 positions shown · non-contrast
Comparison: Previous exam(s).

CLINICAL DATA: Screening.

EXAM:
DIGITAL SCREENING BILATERAL MAMMOGRAM WITH IMPLANTS, CAD AND
TOMOSYNTHESIS
TECHNIQUE: Bilateral screening digital craniocaudal and mediolateral oblique
mammograms were obtained. Bilateral screening digital breast
tomosynthesis was performed. The images were evaluated with
computer-aided detection. Standard and/or implant displaced views
were performed.

[L MLO]
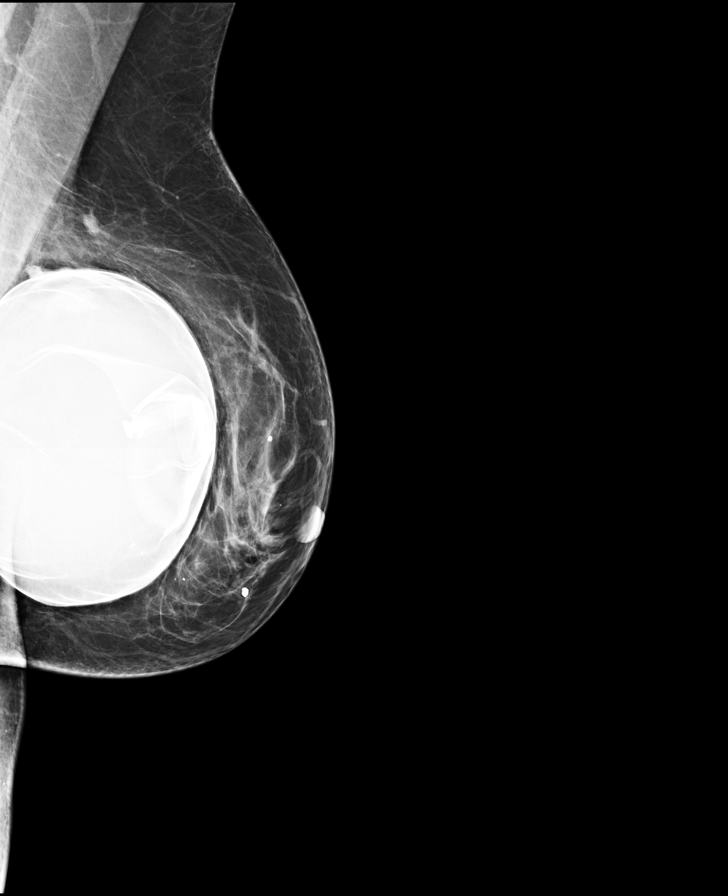

[R MLO]
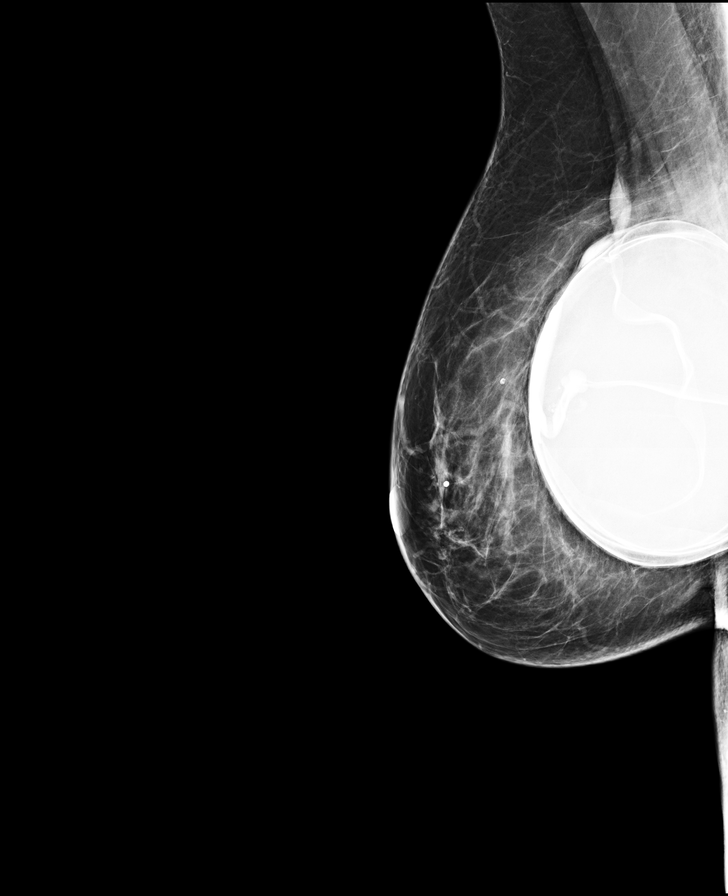

[L CC]
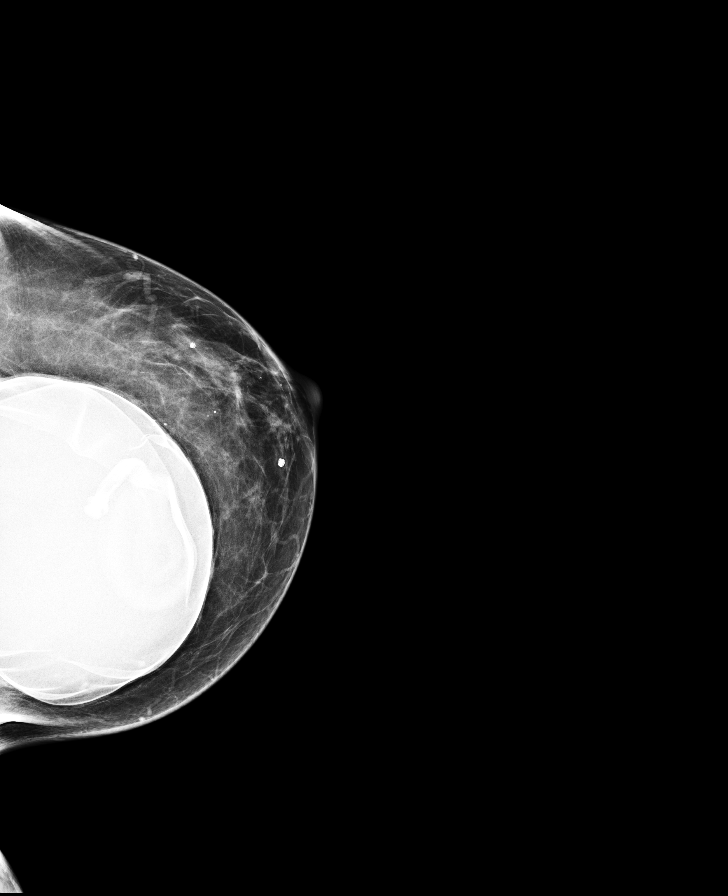

[R CC]
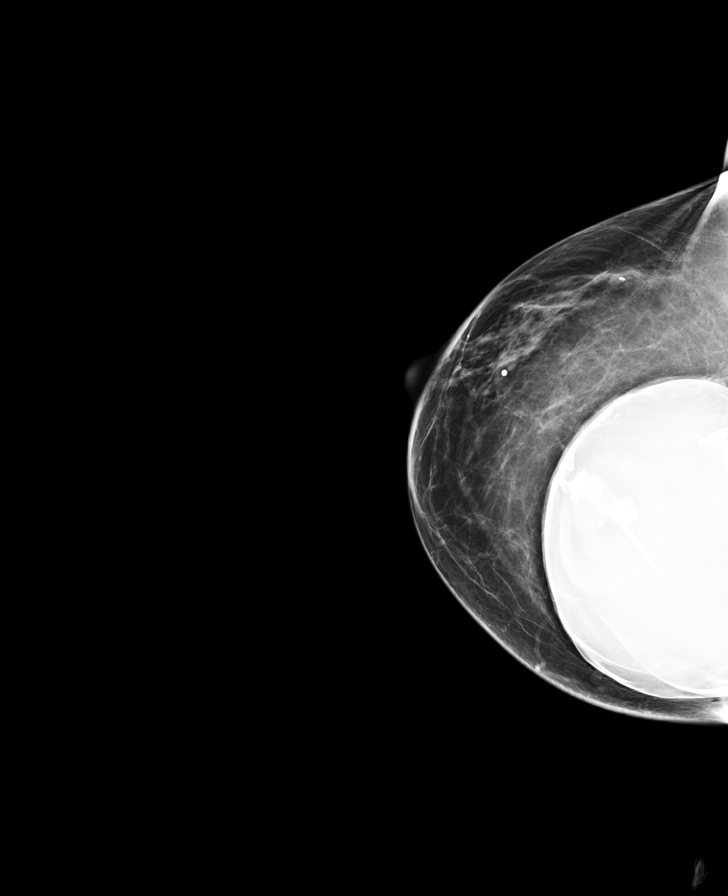

[R MLO synth-2D]
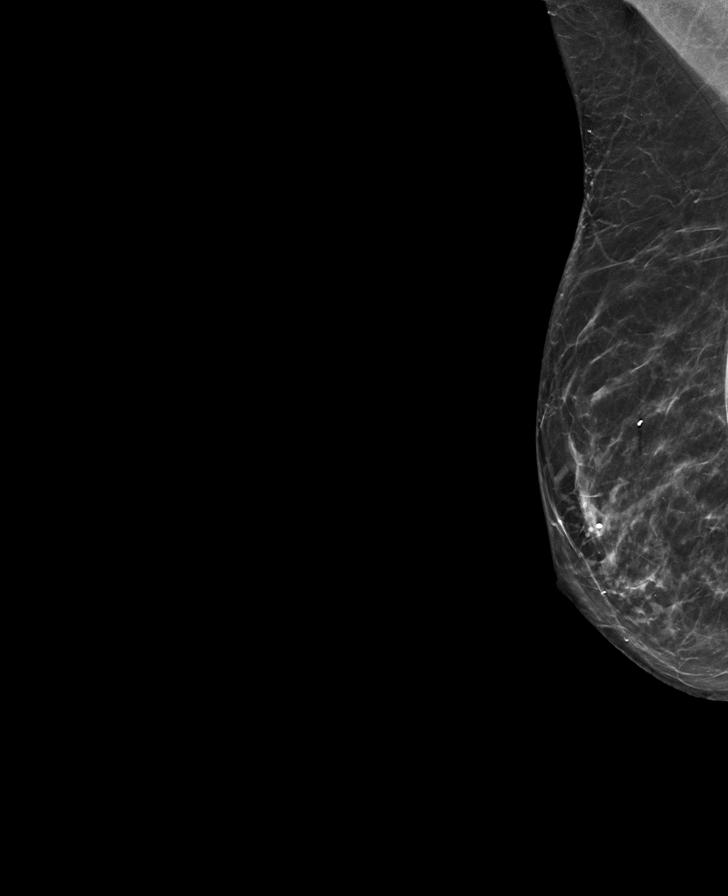

[L MLO synth-2D]
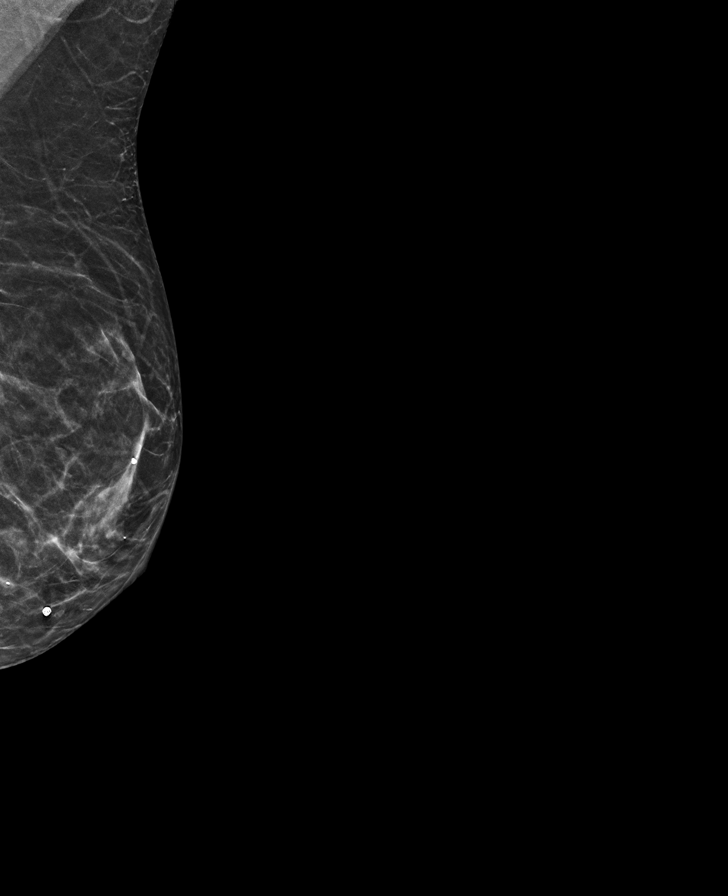

[R CC synth-2D]
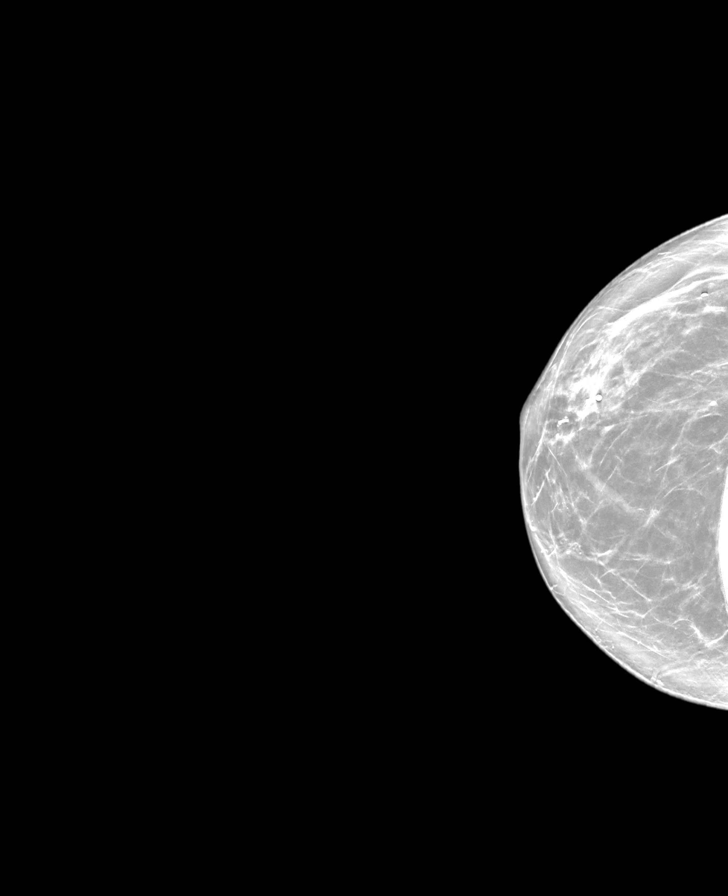

[L CC synth-2D]
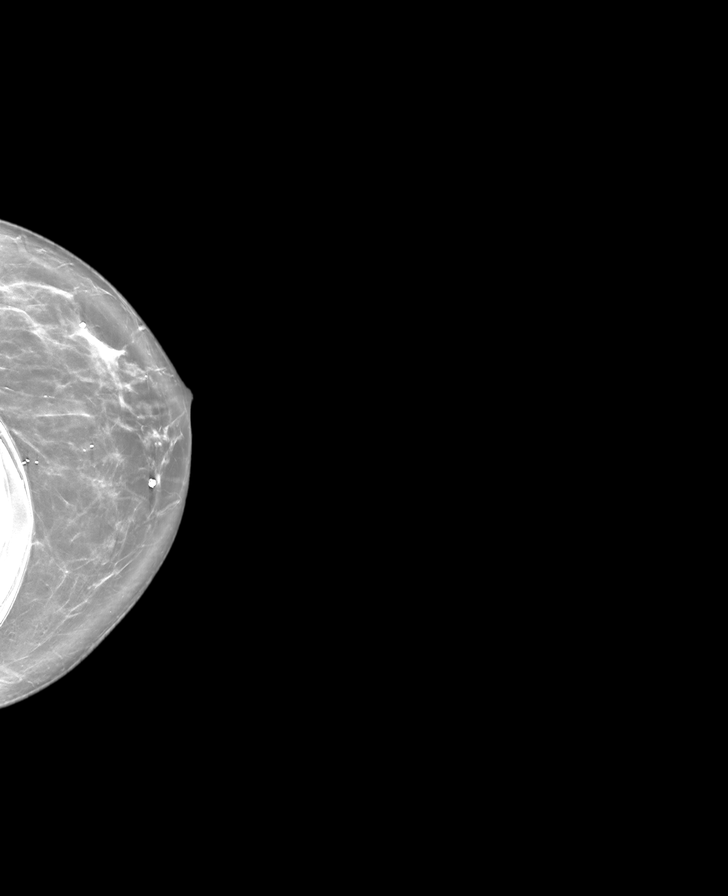

[8 of 28 positions shown; findings below may reference images not displayed]

ACR Breast Density Category b: There are scattered areas of
fibroglandular density.
FINDINGS: The patient has subglandular implants. There are no findings
suspicious for malignancy.
IMPRESSION: No mammographic evidence of malignancy. A result letter of this
screening mammogram will be mailed directly to the patient.

RECOMMENDATION:
Screening mammogram in one year. (Code:[id number])

BI-RADS CATEGORY  1:  Negative.

## 2022-12-19 ENCOUNTER — Other Ambulatory Visit: Payer: Self-pay | Admitting: Internal Medicine

## 2022-12-19 ENCOUNTER — Encounter: Payer: Self-pay | Admitting: Internal Medicine

## 2022-12-19 DIAGNOSIS — R921 Mammographic calcification found on diagnostic imaging of breast: Secondary | ICD-10-CM

## 2023-01-17 ENCOUNTER — Ambulatory Visit
Admission: RE | Admit: 2023-01-17 | Discharge: 2023-01-17 | Disposition: A | Discharge status: Home / Self Care | Payer: Medicare Other | Source: Ambulatory Visit | Attending: Internal Medicine | Admitting: Internal Medicine

## 2023-01-17 ENCOUNTER — Other Ambulatory Visit: Payer: Self-pay | Admitting: Internal Medicine

## 2023-01-17 DIAGNOSIS — R921 Mammographic calcification found on diagnostic imaging of breast: Secondary | ICD-10-CM

## 2023-06-18 ENCOUNTER — Other Ambulatory Visit: Payer: Self-pay | Admitting: Internal Medicine

## 2023-06-18 DIAGNOSIS — R921 Mammographic calcification found on diagnostic imaging of breast: Secondary | ICD-10-CM

## 2023-07-15 ENCOUNTER — Ambulatory Visit
Admission: RE | Admit: 2023-07-15 | Discharge: 2023-07-15 | Disposition: A | Discharge status: Home / Self Care | Source: Ambulatory Visit | Attending: Internal Medicine | Admitting: Internal Medicine

## 2023-07-15 DIAGNOSIS — R921 Mammographic calcification found on diagnostic imaging of breast: Secondary | ICD-10-CM | POA: Diagnosis present
